# Patient Record
Sex: Female | Born: 1945 | Race: White | Hispanic: No | Marital: Married | Smoking: Former smoker
Health system: Southern US, Community
[De-identification: ages and names within clinical notes are randomized; demographics above are authoritative.]

## PROBLEM LIST (undated history)

## (undated) DIAGNOSIS — R112 Nausea with vomiting, unspecified: Secondary | ICD-10-CM

## (undated) DIAGNOSIS — C801 Malignant (primary) neoplasm, unspecified: Secondary | ICD-10-CM

## (undated) DIAGNOSIS — Z923 Personal history of irradiation: Secondary | ICD-10-CM

## (undated) DIAGNOSIS — Z8489 Family history of other specified conditions: Secondary | ICD-10-CM

## (undated) DIAGNOSIS — C50919 Malignant neoplasm of unspecified site of unspecified female breast: Secondary | ICD-10-CM

## (undated) DIAGNOSIS — Z9889 Other specified postprocedural states: Secondary | ICD-10-CM

## (undated) DIAGNOSIS — I10 Essential (primary) hypertension: Secondary | ICD-10-CM

## (undated) DIAGNOSIS — K449 Diaphragmatic hernia without obstruction or gangrene: Secondary | ICD-10-CM

## (undated) DIAGNOSIS — K219 Gastro-esophageal reflux disease without esophagitis: Secondary | ICD-10-CM

## (undated) DIAGNOSIS — Z9221 Personal history of antineoplastic chemotherapy: Secondary | ICD-10-CM

## (undated) HISTORY — PX: COLONOSCOPY: SHX174

## (undated) HISTORY — PX: ROTATOR CUFF REPAIR: SHX139

## (undated) HISTORY — PX: ABDOMINAL HYSTERECTOMY: SHX81

## (undated) HISTORY — PX: MASTECTOMY: SHX3

---

## 1989-10-13 HISTORY — PX: BREAST EXCISIONAL BIOPSY: SUR124

## 1997-10-13 HISTORY — PX: EXCISION / BIOPSY BREAST / NIPPLE / DUCT: SUR469

## 1998-03-01 ENCOUNTER — Other Ambulatory Visit: Admission: RE | Admit: 1998-03-01 | Discharge: 1998-03-01 | Payer: Self-pay | Admitting: General Surgery

## 1998-04-24 ENCOUNTER — Ambulatory Visit (HOSPITAL_COMMUNITY): Admission: RE | Admit: 1998-04-24 | Discharge: 1998-04-24 | Payer: Self-pay | Admitting: *Deleted

## 1998-05-07 ENCOUNTER — Encounter: Admission: RE | Admit: 1998-05-07 | Discharge: 1998-08-05 | Payer: Self-pay | Admitting: *Deleted

## 1998-05-09 ENCOUNTER — Ambulatory Visit (HOSPITAL_COMMUNITY): Admission: RE | Admit: 1998-05-09 | Discharge: 1998-05-09 | Payer: Self-pay | Admitting: *Deleted

## 1998-05-28 ENCOUNTER — Ambulatory Visit (HOSPITAL_COMMUNITY): Admission: RE | Admit: 1998-05-28 | Discharge: 1998-05-28 | Payer: Self-pay | Admitting: Hematology & Oncology

## 1998-05-29 ENCOUNTER — Ambulatory Visit (HOSPITAL_BASED_OUTPATIENT_CLINIC_OR_DEPARTMENT_OTHER): Admission: RE | Admit: 1998-05-29 | Discharge: 1998-05-29 | Payer: Self-pay | Admitting: *Deleted

## 1998-09-14 ENCOUNTER — Encounter: Admission: RE | Admit: 1998-09-14 | Discharge: 1998-12-13 | Payer: Self-pay | Admitting: *Deleted

## 1999-04-30 ENCOUNTER — Other Ambulatory Visit: Admission: RE | Admit: 1999-04-30 | Discharge: 1999-04-30 | Payer: Self-pay | Admitting: Family Medicine

## 1999-11-12 ENCOUNTER — Encounter: Payer: Self-pay | Admitting: Surgery

## 1999-11-12 ENCOUNTER — Encounter: Admission: RE | Admit: 1999-11-12 | Discharge: 1999-11-12 | Payer: Self-pay | Admitting: Surgery

## 2000-04-07 ENCOUNTER — Encounter: Admission: RE | Admit: 2000-04-07 | Discharge: 2000-04-07 | Payer: Self-pay | Admitting: Family Medicine

## 2000-04-07 ENCOUNTER — Encounter: Payer: Self-pay | Admitting: Family Medicine

## 2000-04-30 ENCOUNTER — Other Ambulatory Visit: Admission: RE | Admit: 2000-04-30 | Discharge: 2000-04-30 | Payer: Self-pay | Admitting: Family Medicine

## 2001-04-09 ENCOUNTER — Encounter: Admission: RE | Admit: 2001-04-09 | Discharge: 2001-04-09 | Payer: Self-pay | Admitting: Family Medicine

## 2001-04-09 ENCOUNTER — Encounter: Payer: Self-pay | Admitting: Family Medicine

## 2001-05-06 ENCOUNTER — Other Ambulatory Visit: Admission: RE | Admit: 2001-05-06 | Discharge: 2001-05-06 | Payer: Self-pay | Admitting: Family Medicine

## 2001-12-03 ENCOUNTER — Encounter: Payer: Self-pay | Admitting: Family Medicine

## 2001-12-03 ENCOUNTER — Encounter: Admission: RE | Admit: 2001-12-03 | Discharge: 2001-12-03 | Payer: Self-pay | Admitting: Family Medicine

## 2002-04-11 ENCOUNTER — Encounter: Payer: Self-pay | Admitting: Hematology & Oncology

## 2002-04-11 ENCOUNTER — Encounter: Admission: RE | Admit: 2002-04-11 | Discharge: 2002-04-11 | Payer: Self-pay | Admitting: Hematology & Oncology

## 2002-05-13 ENCOUNTER — Other Ambulatory Visit: Admission: RE | Admit: 2002-05-13 | Discharge: 2002-05-13 | Payer: Self-pay | Admitting: Family Medicine

## 2003-05-17 ENCOUNTER — Other Ambulatory Visit: Admission: RE | Admit: 2003-05-17 | Discharge: 2003-05-17 | Payer: Self-pay | Admitting: Family Medicine

## 2003-06-02 ENCOUNTER — Encounter: Payer: Self-pay | Admitting: Family Medicine

## 2003-06-02 ENCOUNTER — Encounter: Admission: RE | Admit: 2003-06-02 | Discharge: 2003-06-02 | Payer: Self-pay | Admitting: Hematology & Oncology

## 2003-06-02 ENCOUNTER — Encounter: Payer: Self-pay | Admitting: Hematology & Oncology

## 2003-06-02 ENCOUNTER — Encounter: Admission: RE | Admit: 2003-06-02 | Discharge: 2003-06-02 | Payer: Self-pay | Admitting: Family Medicine

## 2004-02-18 ENCOUNTER — Other Ambulatory Visit: Admission: RE | Admit: 2004-02-18 | Discharge: 2004-02-18 | Payer: Self-pay | Admitting: Family Medicine

## 2004-02-20 ENCOUNTER — Encounter: Admission: RE | Admit: 2004-02-20 | Discharge: 2004-02-20 | Payer: Self-pay | Admitting: Family Medicine

## 2004-06-05 ENCOUNTER — Encounter: Admission: RE | Admit: 2004-06-05 | Discharge: 2004-06-05 | Payer: Self-pay | Admitting: Hematology & Oncology

## 2005-01-02 ENCOUNTER — Ambulatory Visit: Payer: Self-pay | Admitting: Hematology & Oncology

## 2005-05-26 ENCOUNTER — Other Ambulatory Visit: Admission: RE | Admit: 2005-05-26 | Discharge: 2005-05-26 | Payer: Self-pay | Admitting: Family Medicine

## 2005-06-09 ENCOUNTER — Encounter: Admission: RE | Admit: 2005-06-09 | Discharge: 2005-06-09 | Payer: Self-pay | Admitting: Family Medicine

## 2006-01-06 ENCOUNTER — Ambulatory Visit: Payer: Self-pay | Admitting: Hematology & Oncology

## 2006-01-16 ENCOUNTER — Emergency Department (HOSPITAL_COMMUNITY): Admission: EM | Admit: 2006-01-16 | Discharge: 2006-01-16 | Payer: Self-pay | Admitting: Family Medicine

## 2006-06-12 ENCOUNTER — Encounter: Admission: RE | Admit: 2006-06-12 | Discharge: 2006-06-12 | Payer: Self-pay | Admitting: Family Medicine

## 2006-08-12 ENCOUNTER — Ambulatory Visit: Payer: Self-pay | Admitting: Hematology & Oncology

## 2007-01-04 ENCOUNTER — Ambulatory Visit: Payer: Self-pay | Admitting: Hematology & Oncology

## 2007-01-06 LAB — CBC WITH DIFFERENTIAL/PLATELET
Eosinophils Absolute: 0.1 10*3/uL (ref 0.0–0.5)
HCT: 41.3 % (ref 34.8–46.6)
LYMPH%: 14.8 % (ref 14.0–48.0)
MCHC: 35.1 g/dL (ref 32.0–36.0)
MONO#: 0.5 10*3/uL (ref 0.1–0.9)
MONO%: 4.6 % (ref 0.0–13.0)
NEUT%: 79.5 % — ABNORMAL HIGH (ref 39.6–76.8)
RDW: 13.1 % (ref 11.3–14.5)
WBC: 10 10*3/uL (ref 3.9–10.0)
lymph#: 1.5 10*3/uL (ref 0.9–3.3)

## 2007-01-06 LAB — COMPREHENSIVE METABOLIC PANEL
ALT: 19 U/L (ref 0–35)
AST: 19 U/L (ref 0–37)
Alkaline Phosphatase: 135 U/L — ABNORMAL HIGH (ref 39–117)
Creatinine, Ser: 0.76 mg/dL (ref 0.40–1.20)
Total Protein: 7.1 g/dL (ref 6.0–8.3)

## 2007-06-21 ENCOUNTER — Encounter: Admission: RE | Admit: 2007-06-21 | Discharge: 2007-06-21 | Payer: Self-pay | Admitting: Family Medicine

## 2007-12-29 ENCOUNTER — Ambulatory Visit: Payer: Self-pay | Admitting: Hematology & Oncology

## 2007-12-31 LAB — COMPREHENSIVE METABOLIC PANEL
ALT: 19 U/L (ref 0–35)
Albumin: 4.3 g/dL (ref 3.5–5.2)
Alkaline Phosphatase: 107 U/L (ref 39–117)
Creatinine, Ser: 0.75 mg/dL (ref 0.40–1.20)
Potassium: 4.9 mEq/L (ref 3.5–5.3)
Total Bilirubin: 0.4 mg/dL (ref 0.3–1.2)

## 2007-12-31 LAB — CBC WITH DIFFERENTIAL/PLATELET
Basophils Absolute: 0 10*3/uL (ref 0.0–0.1)
EOS%: 0.9 % (ref 0.0–7.0)
LYMPH%: 25.9 % (ref 14.0–48.0)
MCHC: 35 g/dL (ref 32.0–36.0)
MONO#: 0.4 10*3/uL (ref 0.1–0.9)
MONO%: 5.5 % (ref 0.0–13.0)
NEUT#: 4.9 10*3/uL (ref 1.5–6.5)
RBC: 4.77 10*6/uL (ref 3.70–5.32)
RDW: 13 % (ref 11.3–14.5)
lymph#: 1.9 10*3/uL (ref 0.9–3.3)

## 2008-06-28 ENCOUNTER — Encounter: Admission: RE | Admit: 2008-06-28 | Discharge: 2008-06-28 | Payer: Self-pay | Admitting: Hematology & Oncology

## 2008-12-14 ENCOUNTER — Ambulatory Visit: Payer: Self-pay | Admitting: Hematology & Oncology

## 2008-12-15 LAB — CBC WITH DIFFERENTIAL (CANCER CENTER ONLY)
BASO#: 0 10*3/uL (ref 0.0–0.2)
LYMPH#: 1.6 10*3/uL (ref 0.9–3.3)
MCH: 30.2 pg (ref 26.0–34.0)
MCHC: 33.5 g/dL (ref 32.0–36.0)
MONO#: 0.3 10*3/uL (ref 0.1–0.9)
MONO%: 4.4 % (ref 0.0–13.0)
Platelets: 226 10*3/uL (ref 145–400)
RDW: 11.2 % (ref 10.5–14.6)

## 2008-12-15 LAB — COMPREHENSIVE METABOLIC PANEL
ALT: 28 U/L (ref 0–35)
Calcium: 9.6 mg/dL (ref 8.4–10.5)
Chloride: 107 mEq/L (ref 96–112)
Creatinine, Ser: 0.72 mg/dL (ref 0.40–1.20)
Glucose, Bld: 108 mg/dL — ABNORMAL HIGH (ref 70–99)
Potassium: 4.9 mEq/L (ref 3.5–5.3)
Sodium: 145 mEq/L (ref 135–145)
Total Bilirubin: 0.3 mg/dL (ref 0.3–1.2)
Total Protein: 6.9 g/dL (ref 6.0–8.3)

## 2009-06-29 ENCOUNTER — Encounter: Admission: RE | Admit: 2009-06-29 | Discharge: 2009-06-29 | Payer: Self-pay | Admitting: Internal Medicine

## 2009-07-09 ENCOUNTER — Encounter: Admission: RE | Admit: 2009-07-09 | Discharge: 2009-07-09 | Payer: Self-pay | Admitting: Internal Medicine

## 2009-12-13 ENCOUNTER — Ambulatory Visit: Payer: Self-pay | Admitting: Hematology & Oncology

## 2009-12-14 LAB — CBC WITH DIFFERENTIAL (CANCER CENTER ONLY)
HCT: 41.9 % (ref 34.8–46.6)
HGB: 14.1 g/dL (ref 11.6–15.9)
LYMPH#: 1.6 10*3/uL (ref 0.9–3.3)
MCHC: 33.6 g/dL (ref 32.0–36.0)
MCV: 90 fL (ref 81–101)
MONO#: 0.3 10*3/uL (ref 0.1–0.9)
NEUT#: 5.5 10*3/uL (ref 1.5–6.5)
RDW: 11.7 % (ref 10.5–14.6)

## 2009-12-14 LAB — COMPREHENSIVE METABOLIC PANEL
Alkaline Phosphatase: 111 U/L (ref 39–117)
CO2: 26 mEq/L (ref 19–32)
Creatinine, Ser: 0.69 mg/dL (ref 0.40–1.20)
Glucose, Bld: 101 mg/dL — ABNORMAL HIGH (ref 70–99)
Total Bilirubin: 0.5 mg/dL (ref 0.3–1.2)

## 2010-07-02 ENCOUNTER — Encounter: Admission: RE | Admit: 2010-07-02 | Discharge: 2010-07-02 | Payer: Self-pay | Admitting: Hematology & Oncology

## 2010-11-04 ENCOUNTER — Encounter: Payer: Self-pay | Admitting: Internal Medicine

## 2011-01-01 ENCOUNTER — Encounter (HOSPITAL_BASED_OUTPATIENT_CLINIC_OR_DEPARTMENT_OTHER): Payer: 59 | Admitting: Hematology & Oncology

## 2011-01-01 ENCOUNTER — Other Ambulatory Visit: Payer: Self-pay | Admitting: Hematology & Oncology

## 2011-01-01 DIAGNOSIS — C50919 Malignant neoplasm of unspecified site of unspecified female breast: Secondary | ICD-10-CM

## 2011-01-01 DIAGNOSIS — Z171 Estrogen receptor negative status [ER-]: Secondary | ICD-10-CM

## 2011-01-01 LAB — CBC WITH DIFFERENTIAL (CANCER CENTER ONLY)
BASO%: 0.3 % (ref 0.0–2.0)
HCT: 39.6 % (ref 34.8–46.6)
MCV: 89 fL (ref 81–101)
MONO%: 5.8 % (ref 0.0–13.0)
NEUT#: 4.6 10*3/uL (ref 1.5–6.5)
RDW: 13.3 % (ref 11.1–15.7)
WBC: 6.7 10*3/uL (ref 3.9–10.0)

## 2011-01-02 LAB — COMPREHENSIVE METABOLIC PANEL
Albumin: 4.3 g/dL (ref 3.5–5.2)
BUN: 21 mg/dL (ref 6–23)
CO2: 25 mEq/L (ref 19–32)
Creatinine, Ser: 0.83 mg/dL (ref 0.40–1.20)
Potassium: 4.7 mEq/L (ref 3.5–5.3)
Sodium: 142 mEq/L (ref 135–145)
Total Bilirubin: 0.4 mg/dL (ref 0.3–1.2)

## 2011-01-02 LAB — VITAMIN D 25 HYDROXY (VIT D DEFICIENCY, FRACTURES): Vit D, 25-Hydroxy: 46 ng/mL (ref 30–89)

## 2011-05-29 ENCOUNTER — Other Ambulatory Visit: Payer: Self-pay | Admitting: Internal Medicine

## 2011-05-29 DIAGNOSIS — Z1231 Encounter for screening mammogram for malignant neoplasm of breast: Secondary | ICD-10-CM

## 2011-07-10 ENCOUNTER — Ambulatory Visit
Admission: RE | Admit: 2011-07-10 | Discharge: 2011-07-10 | Disposition: A | Discharge status: Home / Self Care | Payer: 59 | Source: Ambulatory Visit | Attending: Internal Medicine | Admitting: Internal Medicine

## 2011-07-10 DIAGNOSIS — Z1231 Encounter for screening mammogram for malignant neoplasm of breast: Secondary | ICD-10-CM

## 2012-01-20 ENCOUNTER — Ambulatory Visit (HOSPITAL_BASED_OUTPATIENT_CLINIC_OR_DEPARTMENT_OTHER): Payer: 59 | Admitting: Hematology & Oncology

## 2012-01-20 ENCOUNTER — Other Ambulatory Visit (HOSPITAL_BASED_OUTPATIENT_CLINIC_OR_DEPARTMENT_OTHER): Payer: 59 | Admitting: Lab

## 2012-01-20 VITALS — BP 130/91 | HR 91 | Temp 96.1°F | Ht 67.0 in | Wt 177.0 lb

## 2012-01-20 DIAGNOSIS — C50919 Malignant neoplasm of unspecified site of unspecified female breast: Secondary | ICD-10-CM

## 2012-01-20 DIAGNOSIS — Z853 Personal history of malignant neoplasm of breast: Secondary | ICD-10-CM

## 2012-01-20 LAB — CBC WITH DIFFERENTIAL (CANCER CENTER ONLY)
HCT: 41.7 % (ref 34.8–46.6)
HGB: 13.8 g/dL (ref 11.6–15.9)
MCH: 29.8 pg (ref 26.0–34.0)
MCHC: 33.1 g/dL (ref 32.0–36.0)
MCV: 90 fL (ref 81–101)
NEUT#: 4.6 10*3/uL (ref 1.5–6.5)
NEUT%: 61.4 % (ref 39.6–80.0)
RBC: 4.63 10*6/uL (ref 3.70–5.32)
RDW: 13.2 % (ref 11.1–15.7)
WBC: 7.5 10*3/uL (ref 3.9–10.0)

## 2012-01-20 NOTE — Progress Notes (Signed)
This office note has been dictated.

## 2012-01-21 LAB — VITAMIN D 25 HYDROXY (VIT D DEFICIENCY, FRACTURES): Vit D, 25-Hydroxy: 45 ng/mL (ref 30–89)

## 2012-01-21 LAB — COMPREHENSIVE METABOLIC PANEL
Albumin: 4.3 g/dL (ref 3.5–5.2)
CO2: 30 mEq/L (ref 19–32)
Calcium: 9.4 mg/dL (ref 8.4–10.5)
Chloride: 103 mEq/L (ref 96–112)
Glucose, Bld: 94 mg/dL (ref 70–99)
Potassium: 4.3 mEq/L (ref 3.5–5.3)
Sodium: 141 mEq/L (ref 135–145)
Total Protein: 6.8 g/dL (ref 6.0–8.3)

## 2012-01-26 ENCOUNTER — Telehealth: Payer: Self-pay | Admitting: *Deleted

## 2012-01-26 NOTE — Telephone Encounter (Addendum)
Message copied by Mirian Capuchin on Mon Jan 26, 2012  2:22 PM ------      Message from: Arlan Organ R      Created: Wed Jan 21, 2012  9:22 PM       Call labs ok!! This message left on pt's home answering machine.

## 2012-01-30 NOTE — Progress Notes (Signed)
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  CURRENT THERAPY:  Observation.  INTERVAL HISTORY:  Ms. Ganesh comes in for followup.  We see her yearly.  She has been doing well.  She has had no complaints since we last saw her.  She has had no problems with fatigue or weakness.  There has been no bony pain.  She has had no change in bowel or bladder habits.  Her last mammogram was done back in September 2012.  Everything looked okay on the mammogram.  MEDICATIONS:  She has had no change in medications.  She does take aspirin.  She also takes vitamin D.  PHYSICAL EXAMINATION:  This is a well-developed, well-nourished white female in no obvious distress.  Vital signs:  96.6, pulse 91, respiratory rate 18, blood pressure 130/91.  Weight is 177.  Head and neck exam shows a normocephalic, atraumatic skull.  There are no ocular or oral lesions.  There are no palpable cervical or supraclavicular lymph nodes.  Lungs:  Clear bilaterally.  Cardiac: Regular rate and rhythm with a normal S1 and S2.  There are no murmurs, rubs or bruits. Breasts: Right breast with no masses, edema or erythema.  There is no right axillary adenopathy.  Left breast shows well-healed lumpectomy at the 11 o'clock position.  There is some slight contraction of the left breast.  There is some slight firmness at the lumpectomy site.  No distinct mass is noted in the left breast. There is no left axillary adenopathy.  Abdomen:  Soft with good bowel sounds.  There is no palpable abdominal mass.  There is no fluid wave.  There is no palpable hepatosplenomegaly. Back:  No kyphosis or osteoporotic changes. Extremities:  No clubbing, cyanosis or edema.  She has good range motion of her joints.  Skin:  No rashes, ecchymoses or petechia.  LABORATORY STUDIES:  White cell count 7.5, hemoglobin 13.2, hematocrit 41.7 and platelet count is 219.  Her metabolic panel shows normal  liver function tests.  Alkaline phosphatase is minimally elevated at 136.  Total protein 6.8.  Albumin is 4.3.  IMPRESSION:  Ms. Albano is a 66 year old white female with a history of stage I infiltrating ductal carcinoma left breast.  She underwent adjuvant chemotherapy with 4 cycles Adriamycin/Cytoxan.  She had radiation therapy.  She is now out from her chemotherapy by 13 years. She completed chemotherapy back in November of 1999.  We will continue to see her yearly.  She prefers to come back to see [place] so that we can maintain followup.  I encouraged her to continue the vitamin D, and aspirin.    ______________________________ Josph Macho, M.D. PRE/MEDQ  D:  01/29/2012  T:  01/30/2012  Job:  [id number]

## 2012-06-24 ENCOUNTER — Other Ambulatory Visit: Payer: Self-pay | Admitting: Internal Medicine

## 2012-06-24 DIAGNOSIS — Z1231 Encounter for screening mammogram for malignant neoplasm of breast: Secondary | ICD-10-CM

## 2012-07-14 ENCOUNTER — Ambulatory Visit: Payer: 59

## 2012-07-14 ENCOUNTER — Ambulatory Visit
Admission: RE | Admit: 2012-07-14 | Discharge: 2012-07-14 | Disposition: A | Discharge status: Home / Self Care | Payer: 59 | Source: Ambulatory Visit | Attending: Internal Medicine | Admitting: Internal Medicine

## 2012-07-14 DIAGNOSIS — Z1231 Encounter for screening mammogram for malignant neoplasm of breast: Secondary | ICD-10-CM

## 2012-10-29 ENCOUNTER — Other Ambulatory Visit: Payer: Self-pay | Admitting: Gastroenterology

## 2012-10-29 DIAGNOSIS — IMO0001 Reserved for inherently not codable concepts without codable children: Secondary | ICD-10-CM

## 2012-11-05 ENCOUNTER — Ambulatory Visit
Admission: RE | Admit: 2012-11-05 | Discharge: 2012-11-05 | Disposition: A | Discharge status: Home / Self Care | Payer: 59 | Source: Ambulatory Visit | Attending: Gastroenterology | Admitting: Gastroenterology

## 2012-11-05 DIAGNOSIS — IMO0001 Reserved for inherently not codable concepts without codable children: Secondary | ICD-10-CM

## 2013-01-19 ENCOUNTER — Ambulatory Visit: Payer: 59 | Admitting: Hematology & Oncology

## 2013-01-19 ENCOUNTER — Other Ambulatory Visit: Payer: 59 | Admitting: Lab

## 2013-01-20 ENCOUNTER — Ambulatory Visit (HOSPITAL_BASED_OUTPATIENT_CLINIC_OR_DEPARTMENT_OTHER): Payer: 59 | Admitting: Hematology & Oncology

## 2013-01-20 ENCOUNTER — Other Ambulatory Visit (HOSPITAL_BASED_OUTPATIENT_CLINIC_OR_DEPARTMENT_OTHER): Payer: 59 | Admitting: Lab

## 2013-01-20 VITALS — BP 105/62 | HR 83 | Temp 97.8°F | Resp 16 | Ht 67.0 in | Wt 184.0 lb

## 2013-01-20 DIAGNOSIS — Z853 Personal history of malignant neoplasm of breast: Secondary | ICD-10-CM

## 2013-01-20 DIAGNOSIS — C50912 Malignant neoplasm of unspecified site of left female breast: Secondary | ICD-10-CM

## 2013-01-20 DIAGNOSIS — M810 Age-related osteoporosis without current pathological fracture: Secondary | ICD-10-CM

## 2013-01-20 LAB — CBC WITH DIFFERENTIAL (CANCER CENTER ONLY)
BASO%: 0.5 % (ref 0.0–2.0)
LYMPH#: 1.5 10*3/uL (ref 0.9–3.3)
LYMPH%: 25.4 % (ref 14.0–48.0)
MCV: 90 fL (ref 81–101)
MONO#: 0.4 10*3/uL (ref 0.1–0.9)
Platelets: 220 10*3/uL (ref 145–400)
RDW: 13.5 % (ref 11.1–15.7)
WBC: 5.7 10*3/uL (ref 3.9–10.0)

## 2013-01-20 NOTE — Progress Notes (Signed)
This office note has been dictated.

## 2013-01-21 LAB — COMPREHENSIVE METABOLIC PANEL
ALT: 19 U/L (ref 0–35)
Albumin: 3.9 g/dL (ref 3.5–5.2)
CO2: 30 mEq/L (ref 19–32)
Glucose, Bld: 105 mg/dL — ABNORMAL HIGH (ref 70–99)
Potassium: 4.3 mEq/L (ref 3.5–5.3)
Sodium: 141 mEq/L (ref 135–145)
Total Bilirubin: 0.4 mg/dL (ref 0.3–1.2)
Total Protein: 6.4 g/dL (ref 6.0–8.3)

## 2013-01-21 LAB — VITAMIN D 25 HYDROXY (VIT D DEFICIENCY, FRACTURES): Vit D, 25-Hydroxy: 43 ng/mL (ref 30–89)

## 2013-01-21 NOTE — Progress Notes (Signed)
CC:   Georgann Housekeeper, MD  DIAGNOSIS:  Stage I (T1 N0 M0) infiltrating ductal carcinoma of the left breast.  CURRENT THERAPY:  Observation.  INTERIM HISTORY:  Ms. Mccrumb comes in for her yearly followup.  She had a little bit of a problem with "pneumonia."  She saw Dr. Donette Larry.  He got her on some antibiotics and some decongestant.  She says that she is feeling better.  Her last mammogram was done back in October 2013.  She is having some problems with reflux.  She is going to see Dr. Dulce Sellar of [facility] for upper endoscopy.  She has had no change in bowel or bladder habits.  There has been no leg swelling.  There have been no fevers, sweats, or chills.  There have been no rashes.  She does have quite a few seborrheic keratoses.  PHYSICAL EXAMINATION:  General:  This is a well-developed, well- nourished white female in no obvious distress.  Vital signs: Temperature of 97.8, pulse 83, respiratory rate 16, blood pressure 105/62.  Weight is 184.  Head and neck:  Normocephalic, atraumatic skull.  There are no ocular or oral lesions.  There are no palpable cervical or supraclavicular lymph nodes.  Lungs:  Clear to percussion and auscultation bilaterally.  Cardiac:  Regular rate and rhythm with a normal S1 and S2.  There are no murmurs, rubs, or bruits.  Abdomen: Soft with good bowel sounds.  There is no palpable abdominal mass. There is no fluid wave.  There is no palpable hepatosplenomegaly. Breasts:  Right breast with no masses, edema, or erythema.  There is no right axillary adenopathy.  Left breast shows well-healed lumpectomy at the 11 o'clock position.  There may be some slight contraction of the left breast from radiation.  No distinct mass is noted in the left breast.  There is no left axillary adenopathy.  Back:  No kyphosis or osteoporotic changes.  Extremities:  No clubbing, cyanosis, or edema. Neurological:  No focal neurological deficits.  LABORATORY  STUDIES:  White cell count 5.7, hemoglobin 12.2, hematocrit 38.1, platelet count 220.  IMPRESSION:  Selena Cruz is a 67 year old white female with stage I infiltrating ductal carcinoma of the left breast.  She is now out by almost 14 years.  She completed all of her treatments back in November 1999.  She had 4 cycles of Adriamycin/Cytoxan followed by radiation.  I do not see any issues with respect to long-term complications of her chemotherapy.  We will get her back in 1 more year for followup.    ______________________________ Josph Macho, M.D. PRE/MEDQ  D:  01/20/2013  T:  01/21/2013  Job:  [id number]

## 2013-01-24 ENCOUNTER — Telehealth: Payer: Self-pay

## 2013-01-24 NOTE — Telephone Encounter (Addendum)
Message copied by Leana Gamer on Mon Jan 24, 2013 10:39 AM ------      Message from: Selena Cruz      Created: Sun Jan 23, 2013  8:15 PM       Call - labs and vit D are good!!  Pete ------pt given her labs and vit D  As being good. Pt happy with her result.

## 2013-01-31 ENCOUNTER — Other Ambulatory Visit: Payer: Self-pay | Admitting: Gastroenterology

## 2013-01-31 ENCOUNTER — Encounter (HOSPITAL_COMMUNITY)
Admission: RE | Disposition: A | Discharge status: Home / Self Care | Payer: Self-pay | Source: Ambulatory Visit | Attending: Gastroenterology

## 2013-01-31 ENCOUNTER — Encounter (HOSPITAL_COMMUNITY): Payer: Self-pay | Admitting: *Deleted

## 2013-01-31 ENCOUNTER — Ambulatory Visit (HOSPITAL_COMMUNITY)
Admission: RE | Admit: 2013-01-31 | Discharge: 2013-01-31 | Disposition: A | Discharge status: Home / Self Care | Payer: 59 | Source: Ambulatory Visit | Attending: Gastroenterology | Admitting: Gastroenterology

## 2013-01-31 DIAGNOSIS — K2960 Other gastritis without bleeding: Secondary | ICD-10-CM | POA: Insufficient documentation

## 2013-01-31 DIAGNOSIS — K449 Diaphragmatic hernia without obstruction or gangrene: Secondary | ICD-10-CM | POA: Insufficient documentation

## 2013-01-31 DIAGNOSIS — K219 Gastro-esophageal reflux disease without esophagitis: Secondary | ICD-10-CM | POA: Insufficient documentation

## 2013-01-31 DIAGNOSIS — Z79899 Other long term (current) drug therapy: Secondary | ICD-10-CM | POA: Insufficient documentation

## 2013-01-31 HISTORY — PX: ESOPHAGOGASTRODUODENOSCOPY: SHX5428

## 2013-01-31 HISTORY — DX: Essential (primary) hypertension: I10

## 2013-01-31 HISTORY — DX: Gastro-esophageal reflux disease without esophagitis: K21.9

## 2013-01-31 HISTORY — PX: BRAVO PH STUDY: SHX5421

## 2013-01-31 HISTORY — DX: Malignant (primary) neoplasm, unspecified: C80.1

## 2013-01-31 HISTORY — DX: Diaphragmatic hernia without obstruction or gangrene: K44.9

## 2013-01-31 SURGERY — EGD (ESOPHAGOGASTRODUODENOSCOPY)
Anesthesia: Moderate Sedation

## 2013-01-31 MED ORDER — BUTAMBEN-TETRACAINE-BENZOCAINE 2-2-14 % EX AERO
INHALATION_SPRAY | CUTANEOUS | Status: DC | PRN
  Administered 2013-01-31: 2 via TOPICAL

## 2013-01-31 MED ORDER — MIDAZOLAM HCL 10 MG/2ML IJ SOLN
INTRAMUSCULAR | Status: AC
  Filled 2013-01-31: qty 2

## 2013-01-31 MED ORDER — SODIUM CHLORIDE 0.9 % IV SOLN: INTRAVENOUS | Status: DC

## 2013-01-31 MED ORDER — FENTANYL CITRATE 0.05 MG/ML IJ SOLN
INTRAMUSCULAR | Status: DC | PRN
  Administered 2013-01-31 (×3): 25 ug via INTRAVENOUS

## 2013-01-31 MED ORDER — MIDAZOLAM HCL 10 MG/2ML IJ SOLN
INTRAMUSCULAR | Status: DC | PRN
  Administered 2013-01-31: 1 mg via INTRAVENOUS
  Administered 2013-01-31 (×2): 2 mg via INTRAVENOUS

## 2013-01-31 MED ORDER — FENTANYL CITRATE 0.05 MG/ML IJ SOLN
INTRAMUSCULAR | Status: AC
  Filled 2013-01-31: qty 2

## 2013-01-31 NOTE — H&P (Signed)
Patient interval history reviewed.  Patient examined again.  There has been no change from documented H/P dated 01/18/13 (scanned into chart from our office) except as documented above.  Assessment:  1.  GERD, refractory to escalating medical therapy.  Plan:  1.  EGD with bravo (pH catheter) placement. 2.  Risks (bleeding, infection, bowel perforation that could require surgery, sedation-related changes in cardiopulmonary systems), benefits (identification and possible treatment of source of symptoms, exclusion of certain causes of symptoms), and alternatives (watchful waiting, radiographic imaging studies, empiric medical treatment) of upper endoscopy with Bravo placement (EGD + Bravo) were explained to patient/family in detail and patient wishes to proceed.

## 2013-01-31 NOTE — Op Note (Signed)
[facility] [address] [place], [address]   ENDOSCOPY PROCEDURE REPORT  PATIENT: Selena Cruz, Selena Cruz  MR#: [id number] BIRTHDATE: January 21, 1946 , 67  yrs. old GENDER: Female ENDOSCOPIST: Willis Modena, MD REFERRED BY:  Georgann Housekeeper, M.D. PROCEDURE DATE:  01/31/2013 PROCEDURE:  EGD w/ Bravo capsule placement  (ON PPI and H2RB medication) ASA CLASS:     Class II INDICATIONS:  GERD (refractory to medical therapy). MEDICATIONS: Fentanyl 75 mcg IV and Versed 5 mg IV TOPICAL ANESTHETIC: Cetacaine Spray DESCRIPTION OF PROCEDURE: After the risks benefits and alternatives of the procedure were thoroughly explained, informed consent was obtained.  The endoscope [id number] ([id number]) endoscope was introduced through the mouth and advanced to the second portion of the duodenum. Without limitations.  The instrument was slowly withdrawn as the mucosa was fully examined.    Findings:  Small hiatal hernia.  Mild antral gastritis.  Otherwise normal endoscopy to the second portion of the duodenum.  After completion of our diagnostic exam, a Bravo capsule was placed 6 cm proximal (30cm from incisors) to GE junction (36cm from incisors) in standard fashion.  Second look endoscopy confirmed appropriate capsule positioning.           The scope was then withdrawn from the patient and the procedure completed.  ENDOSCOPIC IMPRESSION:     As above.  Successful capsule placement, as above.  RECOMMENDATIONS:     1.  Watch for potential complications of procedure. 2.  Continue esomeprazole and ranitidine. 3.  Await bravo results. 4.  Follow-up in office in 4-6 weeks.  eSigned:  Willis Modena, MD 01/31/2013 11:58 AM   CC:

## 2013-01-31 NOTE — Addendum Note (Signed)
Addended by: Willis Modena on: 01/31/2013 11:20 AM   Modules accepted: Orders

## 2013-02-01 ENCOUNTER — Encounter (HOSPITAL_COMMUNITY): Payer: Self-pay | Admitting: Gastroenterology

## 2013-05-18 ENCOUNTER — Other Ambulatory Visit: Payer: Self-pay

## 2013-07-19 ENCOUNTER — Other Ambulatory Visit: Payer: Self-pay

## 2013-07-19 DIAGNOSIS — Z1231 Encounter for screening mammogram for malignant neoplasm of breast: Secondary | ICD-10-CM

## 2013-08-02 ENCOUNTER — Other Ambulatory Visit: Payer: Self-pay | Admitting: Internal Medicine

## 2013-08-02 DIAGNOSIS — R748 Abnormal levels of other serum enzymes: Secondary | ICD-10-CM

## 2013-08-05 ENCOUNTER — Ambulatory Visit
Admission: RE | Admit: 2013-08-05 | Discharge: 2013-08-05 | Disposition: A | Discharge status: Home / Self Care | Payer: 59 | Source: Ambulatory Visit | Attending: Internal Medicine | Admitting: Internal Medicine

## 2013-08-05 DIAGNOSIS — R748 Abnormal levels of other serum enzymes: Secondary | ICD-10-CM

## 2013-08-17 ENCOUNTER — Ambulatory Visit
Admission: RE | Admit: 2013-08-17 | Discharge: 2013-08-17 | Disposition: A | Discharge status: Home / Self Care | Payer: 59 | Source: Ambulatory Visit

## 2013-08-17 DIAGNOSIS — Z1231 Encounter for screening mammogram for malignant neoplasm of breast: Secondary | ICD-10-CM

## 2013-08-18 ENCOUNTER — Other Ambulatory Visit: Payer: Self-pay

## 2013-11-19 ENCOUNTER — Emergency Department (INDEPENDENT_AMBULATORY_CARE_PROVIDER_SITE_OTHER)
Admission: EM | Admit: 2013-11-19 | Discharge: 2013-11-19 | Disposition: A | Discharge status: Home / Self Care | Payer: BC Managed Care – PPO | Source: Home / Self Care | Attending: Family Medicine | Admitting: Family Medicine

## 2013-11-19 ENCOUNTER — Encounter (HOSPITAL_COMMUNITY): Payer: Self-pay | Admitting: Emergency Medicine

## 2013-11-19 DIAGNOSIS — B029 Zoster without complications: Secondary | ICD-10-CM

## 2013-11-19 MED ORDER — OXYCODONE-ACETAMINOPHEN 5-325 MG PO TABS: 1.0000 | ORAL_TABLET | Freq: Four times a day (QID) | ORAL | Status: DC | PRN

## 2013-11-19 MED ORDER — VALACYCLOVIR HCL 1 G PO TABS: 1000.0000 mg | ORAL_TABLET | Freq: Three times a day (TID) | ORAL | Status: DC

## 2013-11-19 NOTE — ED Notes (Signed)
Initial discharge condition note not related to this patient

## 2013-11-19 NOTE — ED Notes (Signed)
Patient reports pain in right arm, shoulder and into neck for 3-4 weeks.  Noticed rash for 9 days ago.  Reports increased pain and visible blisters over the past few days

## 2013-11-19 NOTE — ED Provider Notes (Signed)
CSN: [id number]     Arrival date & time 11/19/13  4098 History   First MD Initiated Contact with Patient 11/19/13 1040     Chief Complaint  Patient presents with  . Rash   (Consider location/radiation/quality/duration/timing/severity/associated sxs/prior Treatment) HPI Comments: Pt with R shoulder and arm pain for last 4 weeks. 9 days ago, began developing rash on R elbow that is blisters, red, painful. Aleve helps relieve a bit. Did get zostavax.   Patient is a 68 y.o. female presenting with rash. The history is provided by the patient.  Rash Location:  Shoulder/arm Shoulder/arm rash location:  R shoulder, R upper arm and R forearm Quality: blistering, painful and redness   Pain details:    Quality:  Burning and aching   Severity:  Moderate   Onset quality:  Gradual   Duration:  1 month   Timing:  Constant   Progression:  Worsening Severity:  Moderate Onset quality:  Gradual Duration:  9 days Timing:  Constant Progression:  Spreading Chronicity:  New Relieved by:  OTC analgesics Worsened by:  Nothing tried Associated symptoms: no fever     Past Medical History  Diagnosis Date  . Hypertension   . GERD (gastroesophageal reflux disease)   . Cancer     breast CA  . Hiatal hernia    Past Surgical History  Procedure Laterality Date  . Mastectomy      partial on left  . Abdominal hysterectomy    . Rotator cuff repair    . Colonoscopy    . Esophagogastroduodenoscopy N/A 01/31/2013    Procedure: ESOPHAGOGASTRODUODENOSCOPY (EGD);  Surgeon: Arta Silence, MD;  Location: [facility];  Service: Endoscopy;  Laterality: N/A;  . Bravo ph study N/A 01/31/2013    Procedure: BRAVO Lillian;  Surgeon: Arta Silence, MD;  Location: [facility];  Service: Endoscopy;  Laterality: N/A;   Family History  Problem Relation Age of Onset  . Heart attack Mother   . Heart failure Father   . Heart failure Brother   . CAD Brother    History  Substance Use Topics  . Smoking status:  Former Smoker    Quit date: 09/21/2009  . Smokeless tobacco: Never Used  . Alcohol Use: No   OB History   Grav Para Term Preterm Abortions TAB SAB Ect Mult Living                 Review of Systems  Constitutional: Negative for fever and chills.  Musculoskeletal:       R arm/shoulder pain  Skin: Positive for rash.    Allergies  Review of patient's allergies indicates no known allergies.  Home Medications   Current Outpatient Rx  Name  Route  Sig  Dispense  Refill  . aspirin 81 MG tablet   Oral   Take 81 mg by mouth daily.         Marland Kitchen atorvastatin (LIPITOR) 20 MG tablet   Oral   Take 20 mg by mouth daily.         . Cetirizine HCl (ZYRTEC ALLERGY PO)   Oral   Take by mouth every morning.         . cholecalciferol (VITAMIN D) 1000 UNITS tablet   Oral   Take 1,000 Units by mouth daily.         Marland Kitchen esomeprazole (NEXIUM) 40 MG capsule   Oral   Take 40 mg by mouth daily before breakfast.         .  Flaxseed, Linseed, (FLAX SEEDS PO)   Oral   Take by mouth every morning.         . Multiple Vitamin (MULTI-VITAMIN DAILY PO)   Oral   Take by mouth every morning.         Marland Kitchen oxyCODONE-acetaminophen (PERCOCET/ROXICET) 5-325 MG per tablet   Oral   Take 1 tablet by mouth every 6 (six) hours as needed for severe pain.   6 tablet   0   . Probiotic Product (PROBIOTIC DAILY PO)   Oral   Take by mouth every morning.         . ranitidine (ZANTAC) 150 MG tablet   Oral   Take 150 mg by mouth at bedtime.         Marland Kitchen telmisartan (MICARDIS) 40 MG tablet   Oral   Take 40 mg by mouth daily.         . valACYclovir (VALTREX) 1000 MG tablet   Oral   Take 1 tablet (1,000 mg total) by mouth 3 (three) times daily.   21 tablet   0    BP 142/86  Pulse 80  Temp(Src) 97.2 F (36.2 C) (Oral)  Resp 20  SpO2 94% Physical Exam  Constitutional: She appears well-developed and well-nourished. No distress.  Musculoskeletal:       Arms: Skin: Skin is warm and dry.  Rash noted. Rash is vesicular. There is erythema.  See msk for location of rash.  Sx and exam c/w shingles in R c5 and c6 dermatomes.     ED Course  Procedures (including critical care time) Labs Review Labs Reviewed - No data to display Imaging Review No results found.    MDM   1. Shingles   rx valacyclovir 1000mg  TID #21 and percocet 5/325 1 q6hours prn pain. Pt to f/u with pcp.      Carvel Getting, NP 11/19/13 1046

## 2013-11-19 NOTE — ED Provider Notes (Signed)
Medical screening examination/treatment/procedure(s) were performed by a resident physician or non-physician practitioner and as the supervising physician I was immediately available for consultation/collaboration.  Lynne Leader, MD    Gregor Hams, MD 11/19/13 2030

## 2013-11-19 NOTE — Discharge Instructions (Signed)
Follow up with Dr. Shelia Media.  Continue taking the Aleve every 12 hours if needed for pain. If that isn't working enough to manage your pain, take the prescription oxycodone/acetaminophen medicine (percocet).    Shingles Shingles (herpes zoster) is an infection that is caused by the same virus that causes chickenpox (varicella). The infection causes a painful skin rash and fluid-filled blisters, which eventually break open, crust over, and heal. It may occur in any area of the body, but it usually affects only one side of the body or face. The pain of shingles usually lasts about 1 month. However, some people with shingles may develop long-term (chronic) pain in the affected area of the body. Shingles often occurs many years after the person had chickenpox. It is more common:  In people older than 50 years.  In people with weakened immune systems, such as those with HIV, AIDS, or cancer.  In people taking medicines that weaken the immune system, such as transplant medicines.  In people under great stress. CAUSES  Shingles is caused by the varicella zoster virus (VZV), which also causes chickenpox. After a person is infected with the virus, it can remain in the person's body for years in an inactive state (dormant). To cause shingles, the virus reactivates and breaks out as an infection in a nerve root. The virus can be spread from person to person (contagious) through contact with open blisters of the shingles rash. It will only spread to people who have not had chickenpox. When these people are exposed to the virus, they may develop chickenpox. They will not develop shingles. Once the blisters scab over, the person is no longer contagious and cannot spread the virus to others. SYMPTOMS  Shingles shows up in stages. The initial symptoms may be pain, itching, and tingling in an area of the skin. This pain is usually described as burning, stabbing, or throbbing.In a few days or weeks, a painful red rash  will appear in the area where the pain, itching, and tingling were felt. The rash is usually on one side of the body in a band or belt-like pattern. Then, the rash usually turns into fluid-filled blisters. They will scab over and dry up in approximately 2 3 weeks. Flu-like symptoms may also occur with the initial symptoms, the rash, or the blisters. These may include:  Fever.  Chills.  Headache.  Upset stomach. DIAGNOSIS  Your caregiver will perform a skin exam to diagnose shingles. Skin scrapings or fluid samples may also be taken from the blisters. This sample will be examined under a microscope or sent to a lab for further testing. TREATMENT  There is no specific cure for shingles. Your caregiver will likely prescribe medicines to help you manage the pain, recover faster, and avoid long-term problems. This may include antiviral drugs, anti-inflammatory drugs, and pain medicines. HOME CARE INSTRUCTIONS   Take a cool bath or apply cool compresses to the area of the rash or blisters as directed. This may help with the pain and itching.   Only take over-the-counter or prescription medicines as directed by your caregiver.   Rest as directed by your caregiver.  Keep your rash and blisters clean with mild soap and cool water or as directed by your caregiver.  Do not pick your blisters or scratch your rash. Apply an anti-itch cream or numbing creams to the affected area as directed by your caregiver.  Keep your shingles rash covered with a loose bandage (dressing).  Avoid skin contact with:  Babies.   Pregnant women.   Children with eczema.   Elderly people with transplants.   People with chronic illnesses, such as leukemia or AIDS.   Wear loose-fitting clothing to help ease the pain of material rubbing against the rash.  Keep all follow-up appointments with your caregiver.If the area involved is on your face, you may receive a referral for follow-up to a specialist,  such as an eye doctor (ophthalmologist) or an ear, nose, and throat (ENT) doctor. Keeping all follow-up appointments will help you avoid eye complications, chronic pain, or disability.  SEEK IMMEDIATE MEDICAL CARE IF:   You have facial pain, pain around the eye area, or loss of feeling on one side of your face.  You have ear pain or ringing in your ear.  You have loss of taste.  Your pain is not relieved with prescribed medicines.   Your redness or swelling spreads.   You have more pain and swelling.  Your condition is worsening or has changed.   You have a feveror persistent symptoms for more than 2 3 days.  You have a fever and your symptoms suddenly get worse. MAKE SURE YOU:  Understand these instructions.  Will watch your condition.  Will get help right away if you are not doing well or get worse. Document Released: 09/29/2005 Document Revised: 06/23/2012 Document Reviewed: 05/13/2012 [facility] Patient Information 2014 [place].

## 2013-11-22 ENCOUNTER — Other Ambulatory Visit: Payer: Self-pay | Admitting: Hematology & Oncology

## 2013-11-22 DIAGNOSIS — B0229 Other postherpetic nervous system involvement: Secondary | ICD-10-CM

## 2013-11-22 MED ORDER — GABAPENTIN 300 MG PO CAPS: 300.0000 mg | ORAL_CAPSULE | Freq: Three times a day (TID) | ORAL | Status: DC

## 2014-01-19 ENCOUNTER — Ambulatory Visit (HOSPITAL_BASED_OUTPATIENT_CLINIC_OR_DEPARTMENT_OTHER): Payer: 59 | Admitting: Hematology & Oncology

## 2014-01-19 ENCOUNTER — Other Ambulatory Visit (HOSPITAL_BASED_OUTPATIENT_CLINIC_OR_DEPARTMENT_OTHER): Payer: 59 | Admitting: Lab

## 2014-01-19 ENCOUNTER — Encounter: Payer: Self-pay | Admitting: Hematology & Oncology

## 2014-01-19 VITALS — BP 122/69 | HR 78 | Temp 98.0°F | Resp 14 | Ht 66.0 in | Wt 181.0 lb

## 2014-01-19 DIAGNOSIS — C50919 Malignant neoplasm of unspecified site of unspecified female breast: Secondary | ICD-10-CM

## 2014-01-19 DIAGNOSIS — M810 Age-related osteoporosis without current pathological fracture: Secondary | ICD-10-CM

## 2014-01-19 DIAGNOSIS — C50912 Malignant neoplasm of unspecified site of left female breast: Secondary | ICD-10-CM

## 2014-01-19 DIAGNOSIS — Z853 Personal history of malignant neoplasm of breast: Secondary | ICD-10-CM

## 2014-01-19 DIAGNOSIS — E559 Vitamin D deficiency, unspecified: Secondary | ICD-10-CM

## 2014-01-19 LAB — CBC WITH DIFFERENTIAL (CANCER CENTER ONLY)
BASO#: 0 10*3/uL (ref 0.0–0.2)
BASO%: 0.3 % (ref 0.0–2.0)
EOS ABS: 0.1 10*3/uL (ref 0.0–0.5)
EOS%: 1.1 % (ref 0.0–7.0)
HCT: 40.7 % (ref 34.8–46.6)
HGB: 13.2 g/dL (ref 11.6–15.9)
LYMPH#: 1.4 10*3/uL (ref 0.9–3.3)
LYMPH%: 20 % (ref 14.0–48.0)
MCH: 29.5 pg (ref 26.0–34.0)
MCHC: 32.4 g/dL (ref 32.0–36.0)
MCV: 91 fL (ref 81–101)
MONO#: 0.4 10*3/uL (ref 0.1–0.9)
MONO%: 6.1 % (ref 0.0–13.0)
NEUT%: 72.5 % (ref 39.6–80.0)
NEUTROS ABS: 5.1 10*3/uL (ref 1.5–6.5)
PLATELETS: 214 10*3/uL (ref 145–400)
RBC: 4.48 10*6/uL (ref 3.70–5.32)
RDW: 14.2 % (ref 11.1–15.7)
WBC: 7 10*3/uL (ref 3.9–10.0)

## 2014-01-19 NOTE — Progress Notes (Signed)
Hematology and Oncology Follow Up Visit  Selena Cruz [id number] 1945/12/25 68 y.o. 01/19/2014   Principle Diagnosis:  Stage I (T1 N0 M0) infiltrating ductal carcinoma of the left breast.  Current Therapy:   Observation.     Interim History:  Selena Cruz is is back for followup. We see her yearly. His been 15 years since she had treatment. She's doing well. Her main problem is reflux. She has a lot of reflux. She is seeing gastroenterology. She is on several medications now. She still is having problems with reflux.  Told her to stop the baby aspirin. This may help her all that. Otherwise, she is doing okay. She's had no bony pain. There is no change in bowel bladder habits. She's had no bleeding. Said no leg swelling. There's been no rashes.  Her last mammogram was in November of 2014. This was normal.  Medications: Current outpatient prescriptions:aspirin 81 MG tablet, Take 81 mg by mouth daily., Disp: , Rfl: ;  atorvastatin (LIPITOR) 20 MG tablet, Take 20 mg by mouth daily., Disp: , Rfl: ;  Cetirizine HCl (ZYRTEC ALLERGY PO), Take by mouth every morning., Disp: , Rfl: ;  cholecalciferol (VITAMIN D) 1000 UNITS tablet, Take 1,000 Units by mouth daily., Disp: , Rfl:  esomeprazole (NEXIUM) 40 MG capsule, Take 40 mg by mouth daily before breakfast., Disp: , Rfl: ;  Flaxseed, Linseed, (FLAX SEEDS PO), Take by mouth every morning., Disp: , Rfl: ;  Multiple Vitamin (MULTI-VITAMIN DAILY PO), Take by mouth every morning., Disp: , Rfl: ;  ranitidine (ZANTAC) 150 MG tablet, Take 150 mg by mouth at bedtime., Disp: , Rfl: ;  telmisartan (MICARDIS) 40 MG tablet, Take 40 mg by mouth daily., Disp: , Rfl:   Allergies: No Known Allergies  Past Medical History, Surgical history, Social history, and Family History were reviewed and updated.  Review of Systems: As above  Physical Exam:  height is 5\' 6"  (1.676 m) and weight is 181 lb (82.101 kg). Her oral temperature is 98 F (36.7 C). Her blood  pressure is 122/69 and her pulse is 78. Her respiration is 14.   Well-developed and well-nourished white female. Lungs are clear. Neck shows no adenopathy. Cardiac exam regular in rhythm with no murmurs rubs or bruits. Breasts exam shows a right breast no masses edema or erythema. There is no right axillary adenopathy. Left breast is slightly contracted from radiation. His well-healed lumpectomy at the low opposition. Slight firmness is noted at the lumpectomy site. There is no left axillary adenopathy. Abdomen is soft. Positive bowel sounds. There is no fluid wave. There is no palpable liver or spleen tip. Back exam no kyphosis. No tenderness over the spine ribs or hips. Extremities shows no clubbing cyanosis or edema. Neurological exam shows no focal neurological deficits. Skin exam shows numerous seborrheic keratoses on her back.  Lab Results  Component Value Date   WBC 7.0 01/19/2014   HGB 13.2 01/19/2014   HCT 40.7 01/19/2014   MCV 91 01/19/2014   PLT 214 01/19/2014     Chemistry      Component Value Date/Time   NA 141 01/20/2013 0859   K 4.3 01/20/2013 0859   CL 103 01/20/2013 0859   CO2 30 01/20/2013 0859   BUN 21 01/20/2013 0859   CREATININE 0.97 01/20/2013 0859      Component Value Date/Time   CALCIUM 9.2 01/20/2013 0859   ALKPHOS 117 01/20/2013 0859   AST 18 01/20/2013 0859   ALT 19 01/20/2013  4665   BILITOT 0.4 01/20/2013 0859         Impression and Plan: Selena Cruz is a 68 year old white female with a history of stage I infiltrating ductal carcinoma the left breast. She was ER negative. She underwent adjuvant chemotherapy with 4 cycles of Cytoxan/Adriamycin. She had radiation.  I really think that she is cured. Given that she had your negative disease, I think the chance of recurrence at 15 years [place] less than 5%.  Choice to come by to see his yearly. We'll be more than happy to follow her along.   Volanda Napoleon, MD 4/9/201510:34 AM

## 2014-01-20 LAB — COMPREHENSIVE METABOLIC PANEL
ALBUMIN: 4 g/dL (ref 3.5–5.2)
ALT: 18 U/L (ref 0–35)
AST: 19 U/L (ref 0–37)
Alkaline Phosphatase: 111 U/L (ref 39–117)
BUN: 25 mg/dL — ABNORMAL HIGH (ref 6–23)
CALCIUM: 9.4 mg/dL (ref 8.4–10.5)
CHLORIDE: 106 meq/L (ref 96–112)
CO2: 29 meq/L (ref 19–32)
Creatinine, Ser: 0.98 mg/dL (ref 0.50–1.10)
GLUCOSE: 116 mg/dL — AB (ref 70–99)
POTASSIUM: 4.3 meq/L (ref 3.5–5.3)
SODIUM: 143 meq/L (ref 135–145)
TOTAL PROTEIN: 6.7 g/dL (ref 6.0–8.3)
Total Bilirubin: 0.4 mg/dL (ref 0.2–1.2)

## 2014-01-20 LAB — VITAMIN D 25 HYDROXY (VIT D DEFICIENCY, FRACTURES): VIT D 25 HYDROXY: 45 ng/mL (ref 30–89)

## 2014-01-23 ENCOUNTER — Encounter: Payer: Self-pay | Admitting: Nurse Practitioner

## 2014-07-28 ENCOUNTER — Other Ambulatory Visit: Payer: Self-pay

## 2014-08-02 ENCOUNTER — Encounter: Payer: Self-pay | Admitting: Hematology & Oncology

## 2014-08-04 ENCOUNTER — Other Ambulatory Visit: Payer: Self-pay

## 2014-08-04 DIAGNOSIS — Z1231 Encounter for screening mammogram for malignant neoplasm of breast: Secondary | ICD-10-CM

## 2014-08-24 ENCOUNTER — Ambulatory Visit
Admission: RE | Admit: 2014-08-24 | Discharge: 2014-08-24 | Disposition: A | Discharge status: Home / Self Care | Payer: 59 | Source: Ambulatory Visit

## 2014-08-24 DIAGNOSIS — Z1231 Encounter for screening mammogram for malignant neoplasm of breast: Secondary | ICD-10-CM

## 2015-01-18 ENCOUNTER — Ambulatory Visit: Payer: 59 | Admitting: Hematology & Oncology

## 2015-01-18 ENCOUNTER — Other Ambulatory Visit: Payer: 59 | Admitting: Lab

## 2015-01-30 ENCOUNTER — Ambulatory Visit: Payer: Self-pay | Admitting: Hematology & Oncology

## 2015-01-30 ENCOUNTER — Other Ambulatory Visit: Payer: 59

## 2015-02-19 ENCOUNTER — Ambulatory Visit (HOSPITAL_BASED_OUTPATIENT_CLINIC_OR_DEPARTMENT_OTHER): Payer: 59 | Admitting: Hematology & Oncology

## 2015-02-19 ENCOUNTER — Encounter: Payer: Self-pay | Admitting: Hematology & Oncology

## 2015-02-19 ENCOUNTER — Other Ambulatory Visit (HOSPITAL_BASED_OUTPATIENT_CLINIC_OR_DEPARTMENT_OTHER): Payer: BC Managed Care – PPO

## 2015-02-19 ENCOUNTER — Other Ambulatory Visit: Payer: Self-pay | Admitting: *Deleted

## 2015-02-19 VITALS — BP 138/79 | HR 72 | Temp 97.8°F | Resp 14 | Ht 66.0 in | Wt 178.0 lb

## 2015-02-19 DIAGNOSIS — N952 Postmenopausal atrophic vaginitis: Secondary | ICD-10-CM

## 2015-02-19 DIAGNOSIS — Z853 Personal history of malignant neoplasm of breast: Secondary | ICD-10-CM

## 2015-02-19 DIAGNOSIS — C50919 Malignant neoplasm of unspecified site of unspecified female breast: Secondary | ICD-10-CM

## 2015-02-19 DIAGNOSIS — M810 Age-related osteoporosis without current pathological fracture: Secondary | ICD-10-CM

## 2015-02-19 LAB — CBC WITH DIFFERENTIAL (CANCER CENTER ONLY)
BASO#: 0 10*3/uL (ref 0.0–0.2)
BASO%: 0.3 % (ref 0.0–2.0)
EOS%: 1.5 % (ref 0.0–7.0)
Eosinophils Absolute: 0.1 10*3/uL (ref 0.0–0.5)
HCT: 41.9 % (ref 34.8–46.6)
HGB: 13.8 g/dL (ref 11.6–15.9)
LYMPH#: 1.5 10*3/uL (ref 0.9–3.3)
LYMPH%: 24 % (ref 14.0–48.0)
MCH: 29.4 pg (ref 26.0–34.0)
MCHC: 32.9 g/dL (ref 32.0–36.0)
MCV: 89 fL (ref 81–101)
MONO#: 0.3 10*3/uL (ref 0.1–0.9)
MONO%: 4.8 % (ref 0.0–13.0)
NEUT%: 69.4 % (ref 39.6–80.0)
NEUTROS ABS: 4.3 10*3/uL (ref 1.5–6.5)
PLATELETS: 220 10*3/uL (ref 145–400)
RBC: 4.7 10*6/uL (ref 3.70–5.32)
RDW: 13.7 % (ref 11.1–15.7)
WBC: 6.2 10*3/uL (ref 3.9–10.0)

## 2015-02-19 MED ORDER — ESTRADIOL 0.1 MG/GM VA CREA: 1.0000 | TOPICAL_CREAM | Freq: Every day | VAGINAL | Status: DC

## 2015-02-19 NOTE — Progress Notes (Signed)
Hematology and Oncology Follow Up Visit  Selena Cruz [id number] Oct 17, 1945 69 y.o. 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  Current Therapy:   Observation.     Interim History:  Ms.  Cruz is is back for followup. We see her yearly. His been 16 years since she had treatment. She's doing well. Her sister is not doing all that well. I see her because of a history of melanoma and ductal carcinoma in situ. However, her problem is that she has severe emphysema and is getting worse. Her pulmonologist is living saw gave her sister and able to good pulmonologist who I think will help.  Selena Cruz has had no problems with bowels or bladder. She has had no rashes. She's had no leg swelling. She's had no cough. She's had no headache.   Overall, her performance status is ECOG 0 Medications:  Current outpatient prescriptions:  .  aspirin 81 MG tablet, Take 81 mg by mouth daily., Disp: , Rfl:  .  atorvastatin (LIPITOR) 20 MG tablet, Take 20 mg by mouth daily., Disp: , Rfl:  .  Cetirizine HCl (ZYRTEC ALLERGY PO), Take by mouth every morning., Disp: , Rfl:  .  cholecalciferol (VITAMIN D) 1000 UNITS tablet, Take 1,000 Units by mouth daily., Disp: , Rfl:  .  Multiple Vitamin (MULTI-VITAMIN DAILY PO), Take by mouth every morning., Disp: , Rfl:  .  telmisartan (MICARDIS) 40 MG tablet, Take 40 mg by mouth daily., Disp: , Rfl:  .  UNABLE TO FIND, Take by mouth every morning. ZEGRID, Disp: , Rfl:  .  estradiol (ESTRACE) 0.1 MG/GM vaginal cream, Place 1 Applicatorful vaginally at bedtime., Disp: 42.5 g, Rfl: 12  Allergies: No Known Allergies  Past Medical History, Surgical history, Social history, and Family History were reviewed and updated.  Review of Systems: As above  Physical Exam:  height is 5\' 6"  (1.676 m) and weight is 178 lb (80.74 kg). Her oral temperature is 97.8 F (36.6 C). Her blood pressure is 138/79 and her pulse  is 72. Her respiration is 14.   Well-developed and well-nourished white female. Lungs are clear. Neck shows no adenopathy. Cardiac exam regular rate and in rhythm with no murmurs rubs or bruits. Breasts exam shows a right breast no masses edema or erythema. There is no right axillary adenopathy. Left breast is slightly contracted from radiation. His well-healed lumpectomy at the low opposition. Slight firmness is noted at the lumpectomy site. There is no left axillary adenopathy. Abdomen is soft. Positive bowel sounds. There is no fluid wave. There is no palpable liver or spleen tip. Back exam no kyphosis. No tenderness over the spine ribs or hips. Extremities shows no clubbing cyanosis or edema. Neurological exam shows no focal neurological deficits. Skin exam shows numerous seborrheic keratoses on her back.  Lab Results  Component Value Date   WBC 6.2 02/19/2015   HGB 13.8 02/19/2015   HCT 41.9 02/19/2015   MCV 89 02/19/2015   PLT 220 02/19/2015     Chemistry      Component Value Date/Time   NA 143 01/19/2014 0855   K 4.3 01/19/2014 0855   CL 106 01/19/2014 0855   CO2 29 01/19/2014 0855   BUN 25* 01/19/2014 0855   CREATININE 0.98 01/19/2014 0855      Component Value Date/Time   CALCIUM 9.4 01/19/2014 0855   ALKPHOS 111 01/19/2014 0855   AST 19 01/19/2014 0855  ALT 18 01/19/2014 0855   BILITOT 0.4 01/19/2014 0855         Impression and Plan: Selena Cruz is a 66 white female with a history of stage I infiltrating ductal carcinoma the left breast. She was ER negative. She underwent adjuvant chemotherapy with 4 cycles of Cytoxan/Adriamycin. She had radiation.  I really think that she is cured. Given that she had ER negative disease, I think the chance of recurrence at 15 years is probably less than 5%.  She likes to come by to see [place] yearly. We'll be more than happy to follow her along.   Volanda Napoleon, MD 5/9/201610:34 AM

## 2015-02-20 ENCOUNTER — Telehealth: Payer: Self-pay | Admitting: *Deleted

## 2015-02-20 LAB — COMPREHENSIVE METABOLIC PANEL WITH GFR
ALT: 17 U/L (ref 0–35)
AST: 19 U/L (ref 0–37)
Albumin: 3.9 g/dL (ref 3.5–5.2)
Alkaline Phosphatase: 112 U/L (ref 39–117)
BUN: 20 mg/dL (ref 6–23)
CO2: 28 meq/L (ref 19–32)
Calcium: 9.4 mg/dL (ref 8.4–10.5)
Chloride: 104 meq/L (ref 96–112)
Creatinine, Ser: 0.96 mg/dL (ref 0.50–1.10)
Glucose, Bld: 118 mg/dL — ABNORMAL HIGH (ref 70–99)
Potassium: 3.9 meq/L (ref 3.5–5.3)
Sodium: 142 meq/L (ref 135–145)
Total Bilirubin: 0.4 mg/dL (ref 0.2–1.2)
Total Protein: 6.8 g/dL (ref 6.0–8.3)

## 2015-02-20 LAB — VITAMIN D 25 HYDROXY (VIT D DEFICIENCY, FRACTURES): Vit D, 25-Hydroxy: 63 ng/mL (ref 30–100)

## 2015-02-20 LAB — LACTATE DEHYDROGENASE: LDH: 171 U/L (ref 94–250)

## 2015-02-20 NOTE — Telephone Encounter (Addendum)
Patient aware of results.   ----- Message from Volanda Napoleon, MD sent at 02/19/2015  6:31 PM EDT ----- Please call and let her know that her labs look okay. Thanks.

## 2015-04-09 ENCOUNTER — Other Ambulatory Visit: Payer: Self-pay

## 2015-09-13 ENCOUNTER — Other Ambulatory Visit: Payer: Self-pay

## 2015-09-13 DIAGNOSIS — Z1231 Encounter for screening mammogram for malignant neoplasm of breast: Secondary | ICD-10-CM

## 2015-10-18 ENCOUNTER — Ambulatory Visit
Admission: RE | Admit: 2015-10-18 | Discharge: 2015-10-18 | Disposition: A | Discharge status: Home / Self Care | Payer: 59 | Source: Ambulatory Visit

## 2015-10-18 DIAGNOSIS — Z1231 Encounter for screening mammogram for malignant neoplasm of breast: Secondary | ICD-10-CM

## 2015-10-19 ENCOUNTER — Other Ambulatory Visit: Payer: Self-pay | Admitting: Hematology & Oncology

## 2015-10-19 DIAGNOSIS — R928 Other abnormal and inconclusive findings on diagnostic imaging of breast: Secondary | ICD-10-CM

## 2015-11-01 ENCOUNTER — Ambulatory Visit
Admission: RE | Admit: 2015-11-01 | Discharge: 2015-11-01 | Disposition: A | Discharge status: Home / Self Care | Payer: 59 | Source: Ambulatory Visit | Attending: Hematology & Oncology | Admitting: Hematology & Oncology

## 2015-11-01 DIAGNOSIS — R928 Other abnormal and inconclusive findings on diagnostic imaging of breast: Secondary | ICD-10-CM

## 2016-02-14 ENCOUNTER — Encounter: Payer: Self-pay | Admitting: Hematology & Oncology

## 2016-02-18 ENCOUNTER — Encounter: Payer: Self-pay | Admitting: Hematology & Oncology

## 2016-02-18 ENCOUNTER — Other Ambulatory Visit (HOSPITAL_BASED_OUTPATIENT_CLINIC_OR_DEPARTMENT_OTHER): Payer: 59

## 2016-02-18 ENCOUNTER — Ambulatory Visit (HOSPITAL_BASED_OUTPATIENT_CLINIC_OR_DEPARTMENT_OTHER): Payer: 59 | Admitting: Hematology & Oncology

## 2016-02-18 VITALS — BP 112/66 | HR 69 | Temp 97.6°F | Resp 16 | Ht 66.0 in | Wt 182.0 lb

## 2016-02-18 DIAGNOSIS — N952 Postmenopausal atrophic vaginitis: Secondary | ICD-10-CM

## 2016-02-18 DIAGNOSIS — Z171 Estrogen receptor negative status [ER-]: Secondary | ICD-10-CM

## 2016-02-18 DIAGNOSIS — Z853 Personal history of malignant neoplasm of breast: Secondary | ICD-10-CM

## 2016-02-18 DIAGNOSIS — M810 Age-related osteoporosis without current pathological fracture: Secondary | ICD-10-CM

## 2016-02-18 DIAGNOSIS — C50911 Malignant neoplasm of unspecified site of right female breast: Secondary | ICD-10-CM

## 2016-02-18 LAB — CBC WITH DIFFERENTIAL (CANCER CENTER ONLY)
BASO#: 0 10*3/uL (ref 0.0–0.2)
BASO%: 0.3 % (ref 0.0–2.0)
EOS%: 1.6 % (ref 0.0–7.0)
Eosinophils Absolute: 0.1 10*3/uL (ref 0.0–0.5)
HCT: 40.5 % (ref 34.8–46.6)
HEMOGLOBIN: 13.4 g/dL (ref 11.6–15.9)
LYMPH#: 1.5 10*3/uL (ref 0.9–3.3)
LYMPH%: 20 % (ref 14.0–48.0)
MCH: 29.3 pg (ref 26.0–34.0)
MCHC: 33.1 g/dL (ref 32.0–36.0)
MCV: 88 fL (ref 81–101)
MONO#: 0.4 10*3/uL (ref 0.1–0.9)
MONO%: 5.8 % (ref 0.0–13.0)
NEUT%: 72.3 % (ref 39.6–80.0)
NEUTROS ABS: 5.4 10*3/uL (ref 1.5–6.5)
Platelets: 225 10*3/uL (ref 145–400)
RBC: 4.58 10*6/uL (ref 3.70–5.32)
RDW: 13.2 % (ref 11.1–15.7)
WBC: 7.5 10*3/uL (ref 3.9–10.0)

## 2016-02-18 LAB — COMPREHENSIVE METABOLIC PANEL
ALBUMIN: 3.4 g/dL — AB (ref 3.5–5.0)
ALK PHOS: 110 U/L (ref 40–150)
ALT: 21 U/L (ref 0–55)
AST: 20 U/L (ref 5–34)
Anion Gap: 7 mEq/L (ref 3–11)
BUN: 18.4 mg/dL (ref 7.0–26.0)
CHLORIDE: 106 meq/L (ref 98–109)
CO2: 28 mEq/L (ref 22–29)
Calcium: 9.4 mg/dL (ref 8.4–10.4)
Creatinine: 0.9 mg/dL (ref 0.6–1.1)
EGFR: 67 mL/min/{1.73_m2} — AB (ref >90)
GLUCOSE: 97 mg/dL (ref 70–140)
POTASSIUM: 3.8 meq/L (ref 3.5–5.1)
Sodium: 142 mEq/L (ref 136–145)
Total Bilirubin: 0.39 mg/dL (ref 0.20–1.20)
Total Protein: 6.9 g/dL (ref 6.4–8.3)

## 2016-02-18 NOTE — Progress Notes (Signed)
Hematology and Oncology Follow Up Visit  Selena Cruz [id number] [birth year] 70 y.o. 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  Current Therapy:   Observation.     Interim History:  Selena Cruz is is back for followup. We see her yearly. His been 18 years since she had treatment.   She recently had a mammogram done. There is some issues with the mammogram. She apparently had a problem with the right breast. Biopsies were done but thankfully, there is no evidence of any malignancy. She is a was had cysts.  Her sister is in a nursing home now area and her COPD is getting the best of her. She has had issues with pneumonia.  Otherwise, Selena Cruz is doing well. She has had no change in bowel or bladder habits. She has had no issues with cough. She's had no rashes. She's had no nausea or vomiting. She's had no leg swelling. She does have some arthritic issues but this is chronic.  Overall, her performance status is ECOG 0.     Allergies: No Known Allergies  Past Medical History, Surgical history, Social history, and Family History were reviewed and updated.  Review of Systems: As above  Physical Exam:  height is 5\' 6"  (1.676 m) and weight is 182 lb (82.555 kg). Her oral temperature is 97.6 F (36.4 C). Her blood pressure is 112/66 and her pulse is 69. Her respiration is 16.   Well-developed and well-nourished white female. Lungs are clear. Neck shows no adenopathy. Cardiac exam regular rate and  rhythm with no murmurs rubs or bruits. Breasts exam shows a right breast no masses edema or erythema. There is no right axillary adenopathy. Left breast is slightly contracted from radiation. His well-healed lumpectomy at the low opposition. Slight firmness is noted at the lumpectomy site. There is no left axillary adenopathy. Abdomen is soft. Positive bowel sounds. There is no fluid wave. There is no palpable liver or spleen  tip. Back exam no kyphosis. No tenderness over the spine ribs or hips. Extremities shows no clubbing cyanosis or edema. Neurological exam shows no focal neurological deficits. Skin exam shows numerous seborrheic keratoses on her back.  Lab Results  Component Value Date   WBC 7.5 02/18/2016   HGB 13.4 02/18/2016   HCT 40.5 02/18/2016   MCV 88 02/18/2016   PLT 225 02/18/2016     Chemistry      Component Value Date/Time   NA 142 02/19/2015 0838   K 3.9 02/19/2015 0838   CL 104 02/19/2015 0838   CO2 28 02/19/2015 0838   BUN 20 02/19/2015 0838   CREATININE 0.96 02/19/2015 0838      Component Value Date/Time   CALCIUM 9.4 02/19/2015 0838   ALKPHOS 112 02/19/2015 0838   AST 19 02/19/2015 0838   ALT 17 02/19/2015 0838   BILITOT 0.4 02/19/2015 0838         Impression and Plan: Selena Cruz is a 70 white female with a history of stage I infiltrating ductal carcinoma the left breast. She was ER negative. She underwent adjuvant chemotherapy with 4 cycles of Cytoxan/Adriamycin. She had radiation.  I really think that she is cured. Given that she had ER negative disease, I think the chance of recurrence at 18 years is probably less than 5%.  She likes to come by to see [place] yearly. We'll be more than happy to follow her along.   ENNEVER,PETER  R, MD 5/8/201711:21 AM

## 2016-02-19 LAB — VITAMIN D 25 HYDROXY (VIT D DEFICIENCY, FRACTURES): VIT D 25 HYDROXY: 64.7 ng/mL (ref 30.0–100.0)

## 2016-08-26 ENCOUNTER — Other Ambulatory Visit: Payer: Self-pay | Admitting: Internal Medicine

## 2016-08-26 DIAGNOSIS — D1803 Hemangioma of intra-abdominal structures: Secondary | ICD-10-CM

## 2016-09-09 ENCOUNTER — Ambulatory Visit
Admission: RE | Admit: 2016-09-09 | Discharge: 2016-09-09 | Disposition: A | Discharge status: Home / Self Care | Payer: 59 | Source: Ambulatory Visit | Attending: Internal Medicine | Admitting: Internal Medicine

## 2016-09-09 DIAGNOSIS — D1803 Hemangioma of intra-abdominal structures: Secondary | ICD-10-CM

## 2016-10-21 ENCOUNTER — Other Ambulatory Visit: Payer: Self-pay | Admitting: Internal Medicine

## 2016-10-21 DIAGNOSIS — Z1231 Encounter for screening mammogram for malignant neoplasm of breast: Secondary | ICD-10-CM

## 2016-11-27 ENCOUNTER — Ambulatory Visit
Admission: RE | Admit: 2016-11-27 | Discharge: 2016-11-27 | Disposition: A | Discharge status: Home / Self Care | Payer: 59 | Source: Ambulatory Visit | Attending: Internal Medicine | Admitting: Internal Medicine

## 2016-11-27 DIAGNOSIS — Z1231 Encounter for screening mammogram for malignant neoplasm of breast: Secondary | ICD-10-CM

## 2017-02-27 ENCOUNTER — Other Ambulatory Visit: Payer: Self-pay | Admitting: *Deleted

## 2017-02-27 DIAGNOSIS — C50919 Malignant neoplasm of unspecified site of unspecified female breast: Secondary | ICD-10-CM

## 2017-03-02 ENCOUNTER — Ambulatory Visit (HOSPITAL_BASED_OUTPATIENT_CLINIC_OR_DEPARTMENT_OTHER): Payer: 59 | Admitting: Hematology & Oncology

## 2017-03-02 ENCOUNTER — Other Ambulatory Visit (HOSPITAL_BASED_OUTPATIENT_CLINIC_OR_DEPARTMENT_OTHER): Payer: 59

## 2017-03-02 VITALS — BP 132/88 | HR 75 | Temp 98.0°F | Resp 16 | Wt 187.0 lb

## 2017-03-02 DIAGNOSIS — Z853 Personal history of malignant neoplasm of breast: Secondary | ICD-10-CM

## 2017-03-02 DIAGNOSIS — Z171 Estrogen receptor negative status [ER-]: Secondary | ICD-10-CM

## 2017-03-02 DIAGNOSIS — M818 Other osteoporosis without current pathological fracture: Secondary | ICD-10-CM

## 2017-03-02 DIAGNOSIS — C50919 Malignant neoplasm of unspecified site of unspecified female breast: Secondary | ICD-10-CM

## 2017-03-02 LAB — COMPREHENSIVE METABOLIC PANEL
ALT: 29 U/L (ref 0–55)
ANION GAP: 8 meq/L (ref 3–11)
AST: 27 U/L (ref 5–34)
Albumin: 3.5 g/dL (ref 3.5–5.0)
Alkaline Phosphatase: 132 U/L (ref 40–150)
BUN: 18 mg/dL (ref 7.0–26.0)
CO2: 30 meq/L — AB (ref 22–29)
Calcium: 9.7 mg/dL (ref 8.4–10.4)
Chloride: 105 mEq/L (ref 98–109)
Creatinine: 0.9 mg/dL (ref 0.6–1.1)
EGFR: 65 mL/min/{1.73_m2} — AB (ref >90)
Glucose: 97 mg/dl (ref 70–140)
Potassium: 4.6 mEq/L (ref 3.5–5.1)
Sodium: 143 mEq/L (ref 136–145)
Total Bilirubin: 0.52 mg/dL (ref 0.20–1.20)
Total Protein: 7.1 g/dL (ref 6.4–8.3)

## 2017-03-02 LAB — CBC WITH DIFFERENTIAL (CANCER CENTER ONLY)
BASO#: 0 10*3/uL (ref 0.0–0.2)
BASO%: 0.3 % (ref 0.0–2.0)
EOS%: 1.2 % (ref 0.0–7.0)
Eosinophils Absolute: 0.1 10*3/uL (ref 0.0–0.5)
HCT: 40.6 % (ref 34.8–46.6)
HGB: 13 g/dL (ref 11.6–15.9)
LYMPH#: 1.4 10*3/uL (ref 0.9–3.3)
LYMPH%: 19.9 % (ref 14.0–48.0)
MCH: 28.5 pg (ref 26.0–34.0)
MCHC: 32 g/dL (ref 32.0–36.0)
MCV: 89 fL (ref 81–101)
MONO#: 0.4 10*3/uL (ref 0.1–0.9)
MONO%: 6 % (ref 0.0–13.0)
NEUT#: 4.9 10*3/uL (ref 1.5–6.5)
NEUT%: 72.6 % (ref 39.6–80.0)
PLATELETS: 213 10*3/uL (ref 145–400)
RBC: 4.56 10*6/uL (ref 3.70–5.32)
RDW: 13.9 % (ref 11.1–15.7)
WBC: 6.8 10*3/uL (ref 3.9–10.0)

## 2017-03-02 NOTE — Progress Notes (Signed)
Hematology and Oncology Follow Up Visit  Selena Cruz [id number] May 20, 1946 71 y.o. 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  Current Therapy:   Observation.     Interim History:  Ms.  Cruz is is back for followup. We see her yearly. His been 19 years since she had treatment.   She is quite busy with her family. Her older sister has terrible COPD. She is in a nursing home. A younger brother also now has COPD. He is in the hospital with pneumonia.  Her husband is still working. He is working at the same office for 50 years.  She had a mammogram back in February. Mammogram looked great.  She's had no cough or shortness of breath. She's had no change in bowel or bladder habits. She's had no nausea or vomiting. There's been no leg swelling. She's had no rashes.   Overall, her performance status is ECOG 0.   Allergies: No Known Allergies  Past Medical History, Surgical history, Social history, and Family History were reviewed and updated.  Review of Systems: As above  Physical Exam:  weight is 187 lb (84.8 kg). Her oral temperature is 98 F (36.7 C). Her blood pressure is 132/88 and her pulse is 75. Her respiration is 16 and oxygen saturation is 95%.   Well-developed and well-nourished white female. Lungs are clear. Neck shows no adenopathy. Cardiac exam regular rate and  rhythm with no murmurs rubs or bruits. Breasts exam shows a right breast no masses edema or erythema. There is no right axillary adenopathy. Left breast is slightly contracted from radiation. There is a well-healed lumpectomy at the 11:00 position. Slight firmness is noted at the lumpectomy site. There is no left axillary adenopathy. Abdomen is soft. She has good bowel sounds. There is no fluid wave. There is no palpable liver or spleen tip. Back exam no kyphosis. No tenderness over the spine ribs or hips. Extremities shows no clubbing cyanosis  or edema. Neurological exam shows no focal neurological deficits. Skin exam shows numerous seborrheic keratoses on her back.  Lab Results  Component Value Date   WBC 6.8 03/02/2017   HGB 13.0 03/02/2017   HCT 40.6 03/02/2017   MCV 89 03/02/2017   PLT 213 03/02/2017     Chemistry      Component Value Date/Time   NA 142 02/18/2016 1001   K 3.8 02/18/2016 1001   CL 104 02/19/2015 0838   CO2 28 02/18/2016 1001   BUN 18.4 02/18/2016 1001   CREATININE 0.9 02/18/2016 1001      Component Value Date/Time   CALCIUM 9.4 02/18/2016 1001   ALKPHOS 110 02/18/2016 1001   AST 20 02/18/2016 1001   ALT 21 02/18/2016 1001   BILITOT 0.39 02/18/2016 1001         Impression and Plan: Selena Cruz is a 71 yr old white female with a history of stage I infiltrating ductal carcinoma the left breast. She was ER negative. She underwent adjuvant chemotherapy with 4 cycles of Cytoxan/Adriamycin. She had radiation.  I really think that she is cured. Given that she had ER negative disease, I think the chance of recurrence at 19 years is probably less than 5%.  She likes to come by to see [place] yearly. We'll be more than happy to follow her along.   Volanda Napoleon, MD 5/21/201811:33 AM

## 2017-03-03 ENCOUNTER — Encounter: Payer: Self-pay | Admitting: *Deleted

## 2017-03-03 LAB — VITAMIN D 25 HYDROXY (VIT D DEFICIENCY, FRACTURES): VIT D 25 HYDROXY: 60.7 ng/mL (ref 30.0–100.0)

## 2017-04-27 ENCOUNTER — Emergency Department (HOSPITAL_COMMUNITY): Payer: 59

## 2017-04-27 ENCOUNTER — Emergency Department (HOSPITAL_COMMUNITY)
Admission: EM | Admit: 2017-04-27 | Discharge: 2017-04-27 | Disposition: A | Discharge status: Home / Self Care | Payer: 59 | Attending: Emergency Medicine | Admitting: Emergency Medicine

## 2017-04-27 ENCOUNTER — Encounter (HOSPITAL_COMMUNITY): Payer: Self-pay | Admitting: *Deleted

## 2017-04-27 DIAGNOSIS — R112 Nausea with vomiting, unspecified: Secondary | ICD-10-CM

## 2017-04-27 DIAGNOSIS — Z79899 Other long term (current) drug therapy: Secondary | ICD-10-CM | POA: Insufficient documentation

## 2017-04-27 DIAGNOSIS — Z853 Personal history of malignant neoplasm of breast: Secondary | ICD-10-CM | POA: Diagnosis not present

## 2017-04-27 DIAGNOSIS — Z7982 Long term (current) use of aspirin: Secondary | ICD-10-CM | POA: Insufficient documentation

## 2017-04-27 DIAGNOSIS — I10 Essential (primary) hypertension: Secondary | ICD-10-CM | POA: Insufficient documentation

## 2017-04-27 DIAGNOSIS — R197 Diarrhea, unspecified: Secondary | ICD-10-CM

## 2017-04-27 DIAGNOSIS — E860 Dehydration: Secondary | ICD-10-CM

## 2017-04-27 DIAGNOSIS — Z87891 Personal history of nicotine dependence: Secondary | ICD-10-CM | POA: Insufficient documentation

## 2017-04-27 LAB — URINALYSIS, ROUTINE W REFLEX MICROSCOPIC
Bilirubin Urine: NEGATIVE
GLUCOSE, UA: NEGATIVE mg/dL
KETONES UR: NEGATIVE mg/dL
Leukocytes, UA: NEGATIVE
Nitrite: NEGATIVE
PROTEIN: 30 mg/dL — AB
Specific Gravity, Urine: 1.027 (ref 1.005–1.030)
pH: 5 (ref 5.0–8.0)

## 2017-04-27 LAB — LIPASE, BLOOD: LIPASE: 19 U/L (ref 11–51)

## 2017-04-27 LAB — COMPREHENSIVE METABOLIC PANEL
ALBUMIN: 3.4 g/dL — AB (ref 3.5–5.0)
ALK PHOS: 114 U/L (ref 38–126)
ALT: 30 U/L (ref 14–54)
ANION GAP: 10 (ref 5–15)
AST: 34 U/L (ref 15–41)
BUN: 17 mg/dL (ref 6–20)
CALCIUM: 8.7 mg/dL — AB (ref 8.9–10.3)
CO2: 26 mmol/L (ref 22–32)
CREATININE: 1.1 mg/dL — AB (ref 0.44–1.00)
Chloride: 98 mmol/L — ABNORMAL LOW (ref 101–111)
GFR calc Af Amer: 56 mL/min — ABNORMAL LOW (ref >60)
GFR calc non Af Amer: 49 mL/min — ABNORMAL LOW (ref >60)
GLUCOSE: 118 mg/dL — AB (ref 65–99)
Potassium: 3.9 mmol/L (ref 3.5–5.1)
SODIUM: 134 mmol/L — AB (ref 135–145)
Total Bilirubin: 0.5 mg/dL (ref 0.3–1.2)
Total Protein: 7.3 g/dL (ref 6.5–8.1)

## 2017-04-27 LAB — CBC
HCT: 39.8 % (ref 36.0–46.0)
HEMOGLOBIN: 12.8 g/dL (ref 12.0–15.0)
MCH: 28.4 pg (ref 26.0–34.0)
MCHC: 32.2 g/dL (ref 30.0–36.0)
MCV: 88.2 fL (ref 78.0–100.0)
Platelets: 183 10*3/uL (ref 150–400)
RBC: 4.51 MIL/uL (ref 3.87–5.11)
RDW: 14.5 % (ref 11.5–15.5)
WBC: 9 10*3/uL (ref 4.0–10.5)

## 2017-04-27 MED ORDER — ONDANSETRON HCL 4 MG PO TABS: 4.0000 mg | ORAL_TABLET | Freq: Three times a day (TID) | ORAL | 0 refills | Status: DC | PRN

## 2017-04-27 MED ORDER — ALBUTEROL SULFATE HFA 108 (90 BASE) MCG/ACT IN AERS
2.0000 | INHALATION_SPRAY | Freq: Four times a day (QID) | RESPIRATORY_TRACT | Status: DC | PRN
  Administered 2017-04-27: 2 via RESPIRATORY_TRACT
  Filled 2017-04-27: qty 6.7

## 2017-04-27 MED ORDER — ONDANSETRON HCL 4 MG/2ML IJ SOLN
4.0000 mg | Freq: Once | INTRAMUSCULAR | Status: AC
  Administered 2017-04-27: 4 mg via INTRAVENOUS
  Filled 2017-04-27: qty 2

## 2017-04-27 MED ORDER — SODIUM CHLORIDE 0.9 % IV BOLUS (SEPSIS)
1000.0000 mL | Freq: Once | INTRAVENOUS | Status: AC
  Administered 2017-04-27: 1000 mL via INTRAVENOUS

## 2017-04-27 MED ORDER — IPRATROPIUM-ALBUTEROL 0.5-2.5 (3) MG/3ML IN SOLN
3.0000 mL | Freq: Once | RESPIRATORY_TRACT | Status: AC
  Administered 2017-04-27: 3 mL via RESPIRATORY_TRACT
  Filled 2017-04-27: qty 3

## 2017-04-27 MED ORDER — PREDNISONE 20 MG PO TABS
60.0000 mg | ORAL_TABLET | Freq: Once | ORAL | Status: AC
  Administered 2017-04-27: 60 mg via ORAL
  Filled 2017-04-27: qty 3

## 2017-04-27 MED ORDER — BENZONATATE 100 MG PO CAPS
100.0000 mg | ORAL_CAPSULE | Freq: Once | ORAL | Status: AC
  Administered 2017-04-27: 100 mg via ORAL
  Filled 2017-04-27: qty 1

## 2017-04-27 NOTE — ED Provider Notes (Signed)
[facility] DEPT Provider Note   CSN: [id number] Arrival date & time: 04/27/17  1058     History   Chief Complaint Chief Complaint  Patient presents with  . Nausea  . Emesis    HPI Selena Cruz is a 71 y.o. female.  The history is provided by the patient.  Illness  This is a new problem. The current episode started more than 2 days ago. The problem occurs constantly. The problem has been gradually worsening. Pertinent negatives include no chest pain, no headaches and no shortness of breath. Associated symptoms comments: Nausea, vomiting, diarrhea, cough, congestion.. Nothing aggravates the symptoms. Nothing relieves the symptoms. She has tried acetaminophen and rest for the symptoms.    Past Medical History:  Diagnosis Date  . [facility])    breast CA  . GERD (gastroesophageal reflux disease)   . Hiatal hernia   . Hypertension     There are no active problems to display for this patient.   Past Surgical History:  Procedure Laterality Date  . ABDOMINAL HYSTERECTOMY    . BRAVO [place] STUDY N/A 01/31/2013   Procedure: BRAVO [place];  Surgeon: Arta Silence, MD;  Location: [facility] ENDOSCOPY;  Service: Endoscopy;  Laterality: N/A;  . BREAST EXCISIONAL BIOPSY Right 1991  . COLONOSCOPY    . ESOPHAGOGASTRODUODENOSCOPY N/A 01/31/2013   Procedure: ESOPHAGOGASTRODUODENOSCOPY (EGD);  Surgeon: Arta Silence, MD;  Location: [facility] ENDOS[facility];  Service: Endoscopy;  Laterality: N/A;  . EXCISION / BIOPSY BREAST / NIPPLE / DUCT Left 1999  . MASTECTOMY     partial on left  . ROTATOR CUFF REPAIR      OB History    No data available       Home Medications    Prior to Admission medications   Medication Sig Start Date End Date Taking? Authorizing Provider  aspirin 81 MG tablet Take 81 mg by mouth daily.    [provider]  atorvastatin (LIPITOR) 20 MG tablet Take 20 mg by mouth daily.    [provider]  estradiol (ESTRACE) 0.1 MG/GM vaginal cream Place 1  Applicatorful vaginally at bedtime. 02/19/15   Volanda Napoleon, MD  Multiple Vitamin (MULTI-VITAMIN DAILY PO) Take by mouth every morning.    [provider]  ondansetron (ZOFRAN) 4 MG tablet Take 1 tablet (4 mg total) by mouth every 8 (eight) hours as needed for nausea or vomiting. 04/27/17   Valda Lamb, MD  telmisartan (MICARDIS) 40 MG tablet Take 40 mg by mouth daily.    [provider]  UNABLE TO FIND Take by mouth every morning. ZEGRID    [provider]  Vitamin D, Ergocalciferol, (DRISDOL) 50000 units CAPS capsule Take 50,000 Units by mouth daily.    [provider]    Family History Family History  Problem Relation Age of Onset  . Heart attack Mother   . Heart failure Father   . Breast cancer Sister   . Heart failure Brother   . CAD Brother   . Breast cancer Maternal Aunt     Social History Social History  Substance Use Topics  . Smoking status: Former Smoker    Packs/day: 1.00    Years: 50.00    Types: Cigarettes    Start date: 09/21/1959    Quit date: 09/21/2009  . Smokeless tobacco: Never Used     Comment: quit 4 years ago  . Alcohol use No     Allergies   Patient has no known allergies.   Review  of Systems Review of Systems  Constitutional: Negative for chills and fever.  HENT: Negative for ear pain and sore throat.   Eyes: Negative for pain and visual disturbance.  Respiratory: Positive for cough and wheezing. Negative for chest tightness and shortness of breath.   Cardiovascular: Negative for chest pain and palpitations.  Gastrointestinal: Positive for diarrhea, nausea and vomiting. Negative for abdominal distention, anal bleeding, blood in stool and constipation.  Endocrine: Negative for polyuria.  Genitourinary: Negative for dysuria and hematuria.  Musculoskeletal: Negative for arthralgias and back pain.  Skin: Negative for color change and rash.  Neurological: Negative for seizures, syncope and headaches.    Hematological: Does not bruise/bleed easily.  Psychiatric/Behavioral: Negative for agitation and behavioral problems.  All other systems reviewed and are negative.    Physical Exam Updated Vital Signs BP 125/62 (BP Location: Right Arm)   Pulse 95   Temp 98.7 F (37.1 C) (Oral)   Resp 16   SpO2 95%   Physical Exam  Constitutional: She is oriented to person, place, and time. She appears well-developed and well-nourished. No distress.  HENT:  Head: Normocephalic and atraumatic.  Mouth/Throat: Oropharynx is clear and moist.  Eyes: Pupils are equal, round, and reactive to light. Conjunctivae and EOM are normal.  Neck: Neck supple. No tracheal deviation present.  Cardiovascular: Normal rate and regular rhythm.   No murmur heard. Pulmonary/Chest: Effort normal. No respiratory distress. She has wheezes.  Coarse breath sounds bilaterally  Abdominal: Soft. She exhibits no distension. There is no tenderness. There is no rebound and no guarding.  Musculoskeletal: She exhibits no edema or deformity.  Neurological: She is alert and oriented to person, place, and time. No cranial nerve deficit.  Skin: Skin is warm and dry.  Psychiatric: She has a normal mood and affect.  Nursing note and vitals reviewed.    ED Treatments / Results  Labs (all labs ordered are listed, but only abnormal results are displayed) Labs Reviewed  COMPREHENSIVE METABOLIC PANEL - Abnormal; Notable for the following:       Result Value   Sodium 134 (*)    Chloride 98 (*)    Glucose, Bld 118 (*)    Creatinine, Ser 1.10 (*)    Calcium 8.7 (*)    Albumin 3.4 (*)    GFR calc non Af Amer 49 (*)    GFR calc Af Amer 56 (*)    All other components within normal limits  URINALYSIS, ROUTINE W REFLEX MICROSCOPIC - Abnormal; Notable for the following:    Color, Urine AMBER (*)    APPearance HAZY (*)    Hgb urine dipstick SMALL (*)    Protein, ur 30 (*)    Bacteria, UA RARE (*)    Squamous Epithelial / LPF 6-30 (*)     Non Squamous Epithelial 0-5 (*)    All other components within normal limits  CBC  LIPASE, BLOOD    EKG  EKG Interpretation None       Radiology Dg Chest 2 View  Result Date: 04/27/2017 CLINICAL DATA:  Cough, wheezing shortness breath EXAM: CHEST  2 VIEW COMPARISON:  09/24/2015 FINDINGS: The heart size and mediastinal contours are within normal limits. Both lungs are clear. The visualized skeletal structures are unremarkable. IMPRESSION: No active cardiopulmonary disease. Electronically Signed   By: Kathreen Devoid   On: 04/27/2017 12:10    Procedures Procedures (including critical care time)  Medications Ordered in ED Medications  sodium chloride 0.9 % bolus 1,000 mL (0  mLs Intravenous Stopped 04/27/17 1732)  ondansetron (ZOFRAN) injection 4 mg (4 mg Intravenous Given 04/27/17 1703)  benzonatate (TESSALON) capsule 100 mg (100 mg Oral Given 04/27/17 1735)  ipratropium-albuterol (DUONEB) 0.5-2.5 (3) MG/3ML nebulizer solution 3 mL (3 mLs Nebulization Given 04/27/17 1735)  predniSONE (DELTASONE) tablet 60 mg (60 mg Oral Given 04/27/17 1735)     Initial Impression / Assessment and Plan / ED Course  I have reviewed the triage vital signs and the nursing notes.  Pertinent labs & imaging results that were available during my care of the patient were reviewed by me and considered in my medical decision making (see chart for details).     Patient presenting with nausea vomiting and diarrhea as well as upper respirations symptoms. Patient states her diarrhea started after starting Augmentin- this is likely the cause. It has been nonbloody. Also endorses URI symptoms. Patient has some coarse lung sounds bilaterally with wheezing. We'll give breathing treatment as well as steroids. Also pt with no fevers or leukocytosis. Labs consistent with mild dehydration, will give bolus of fluids as well as Zofran.   Upon recheck 30 minutes later, patient feeling much better after breathing treatment as  well as bolus and Zofran. Lung sound much better. Likely URI. Patient discharged with zofran and albuterol and told to follow up with primary care in the next few days for recheck. Unlikely asthma exacerbation/COPD; do not believe course of steroids necessary. She voiced understanding and agreement to the plan and was comfortable with outpatient management. Given strict return precautions.  Patient was seen with my attending, Dr. Dayna Barker, who voiced agreement and oversaw the evaluation and treatment of this patient.   [employer] seismologist was used in the creation of this note. If there are any errors or inconsistencies needing clarification, please contact me directly.   Final Clinical Impressions(s) / ED Diagnoses   Final diagnoses:  Non-intractable vomiting with nausea, unspecified vomiting type  Dehydration  Diarrhea, unspecified type    New Prescriptions Discharge Medication List as of 04/27/2017  6:47 PM    START taking these medications   Details  ondansetron (ZOFRAN) 4 MG tablet Take 1 tablet (4 mg total) by mouth every 8 (eight) hours as needed for nausea or vomiting., Starting Mon 04/27/2017, Print         Valda Lamb, MD 04/28/17 0004    Merrily Pew, MD 04/28/17 1717

## 2017-04-27 NOTE — ED Notes (Signed)
Pt A&Ox4, ambulatory at d/c with steady gait, NAD and verbalized understanding of d/c instructions and follow up care. Pt states she feels much better and is breathing much better.

## 2017-04-27 NOTE — ED Triage Notes (Signed)
To ED for eval of n/v/d/cough/fever for past couple of weeks. [place]. [facility]en at [facility] and given URI meds. Not feeling any better. States she has lost 6 lbs in past week due to n/v/d. Appears in nad. Noted fever at home this am.

## 2017-10-13 HISTORY — PX: KNEE ARTHROSCOPY W/ MENISCAL REPAIR: SHX1877

## 2017-10-27 IMAGING — US US ABDOMEN COMPLETE
1 series · 13 of 25 positions shown · non-contrast
Comparison: Ultrasound August 05, 2013.

CLINICAL DATA: Hepatic hemangioma.

EXAM:
ABDOMEN ULTRASOUND COMPLETE

[Series 1: us abdomen complete · 0.32mm/px · 13 of 102 slices shown]
[im 1/102]
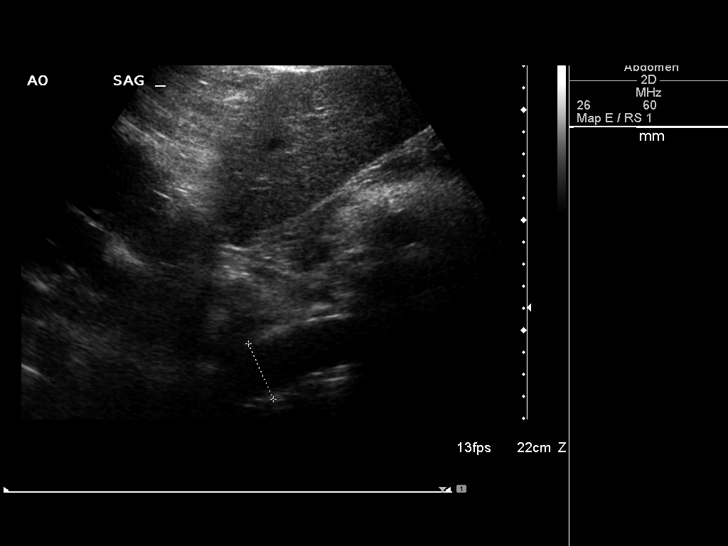
[im 9/102]
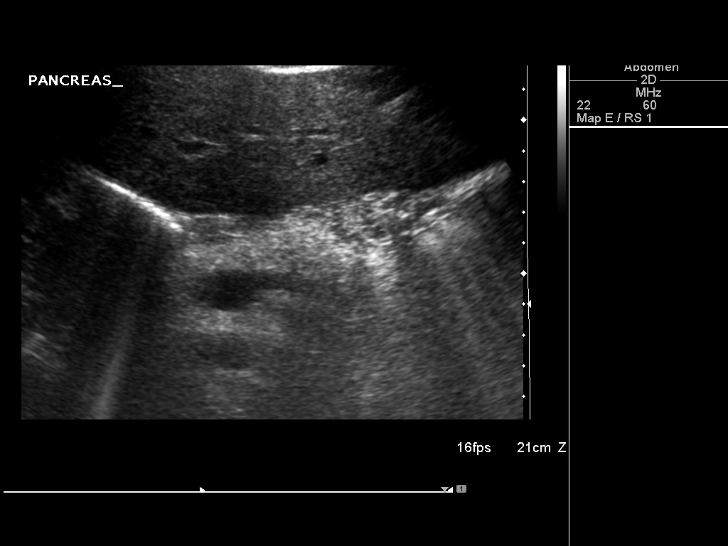
[im 17/102]
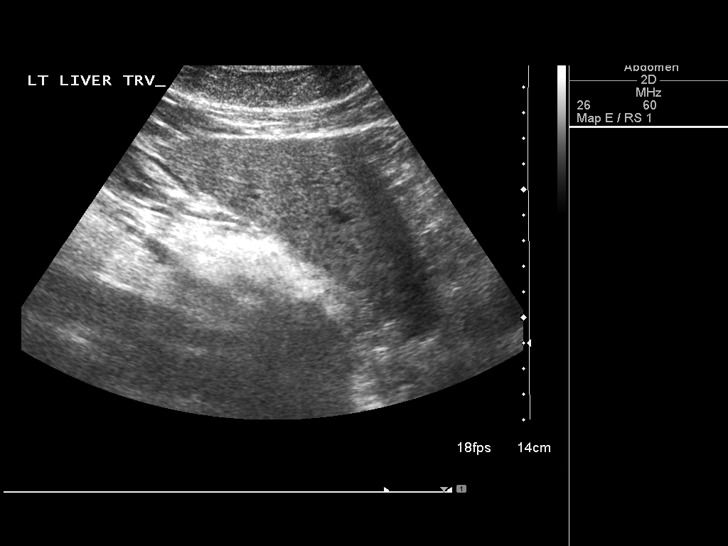
[im 26/102]
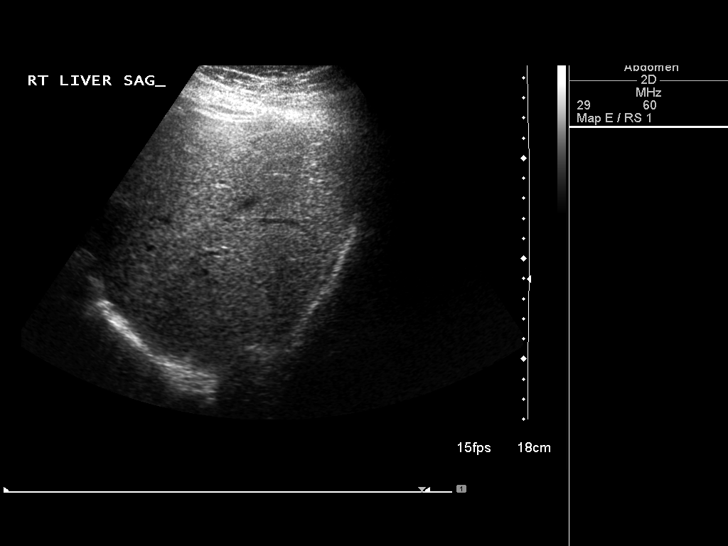
[im 34/102]
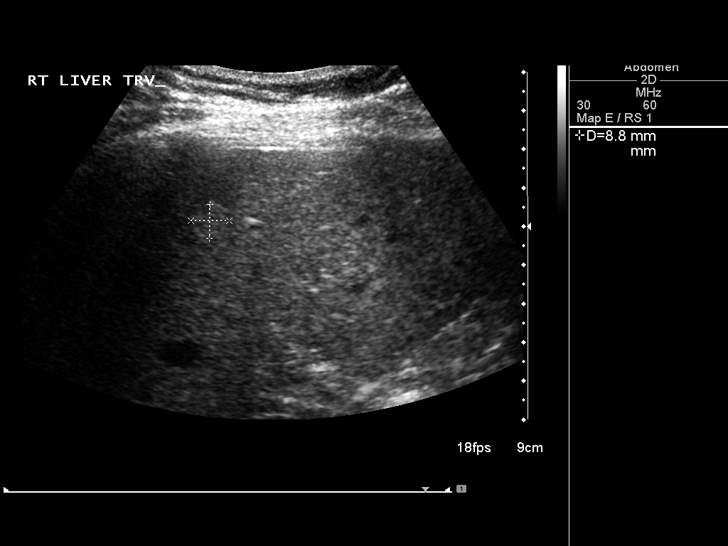
[im 43/102]
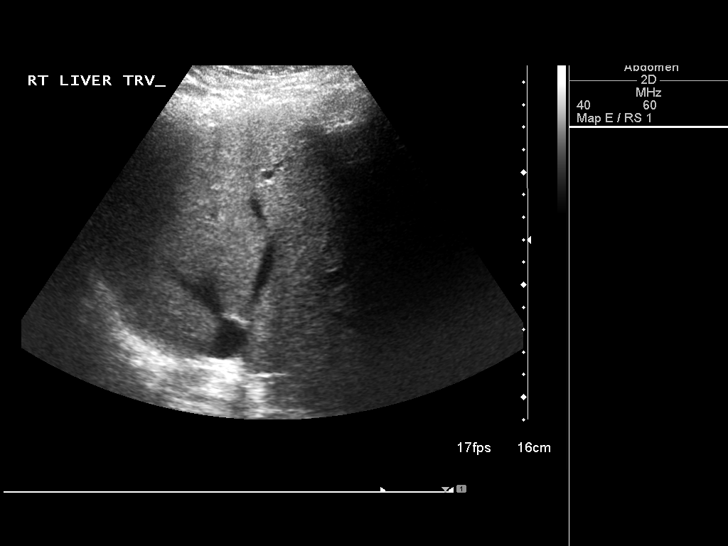
[im 51/102]
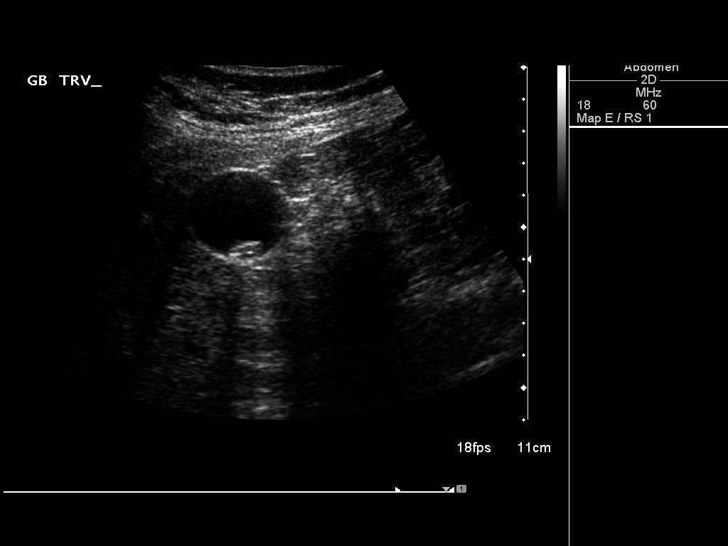
[im 59/102]
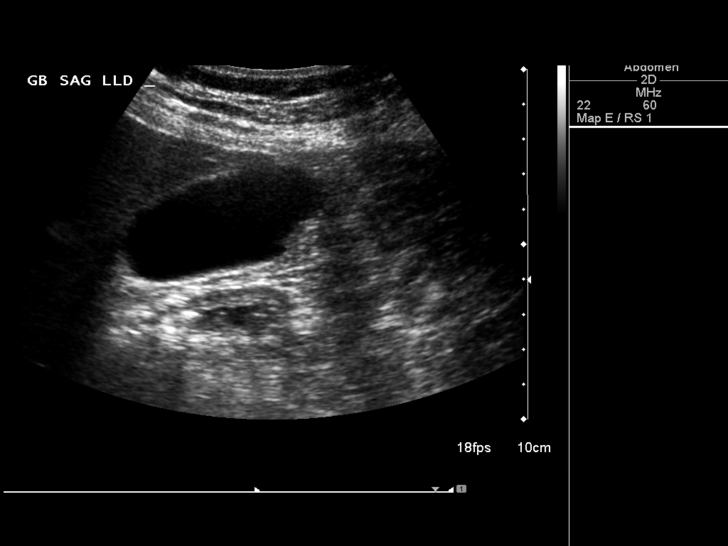
[im 68/102]
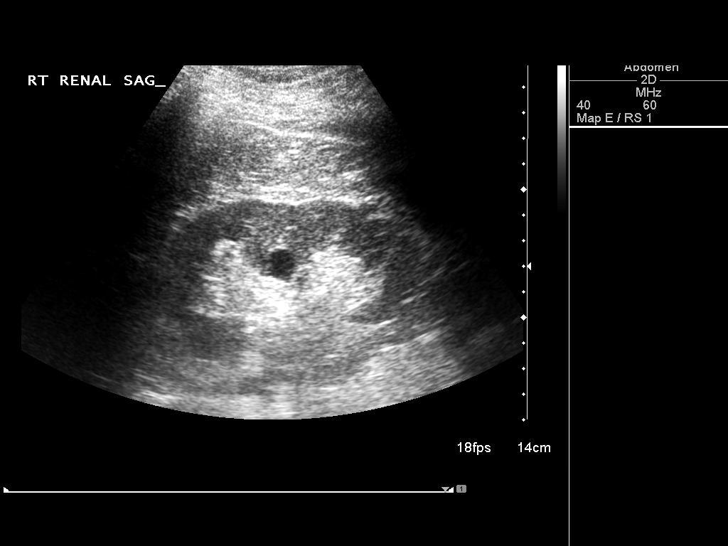
[im 76/102]
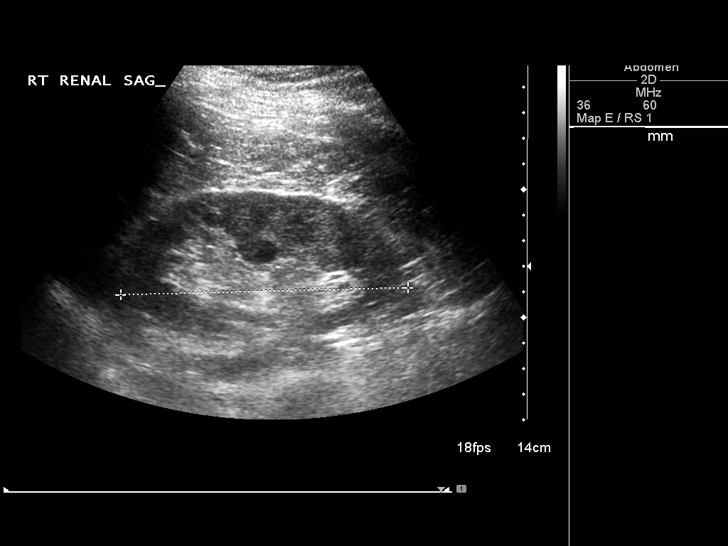
[im 85/102]
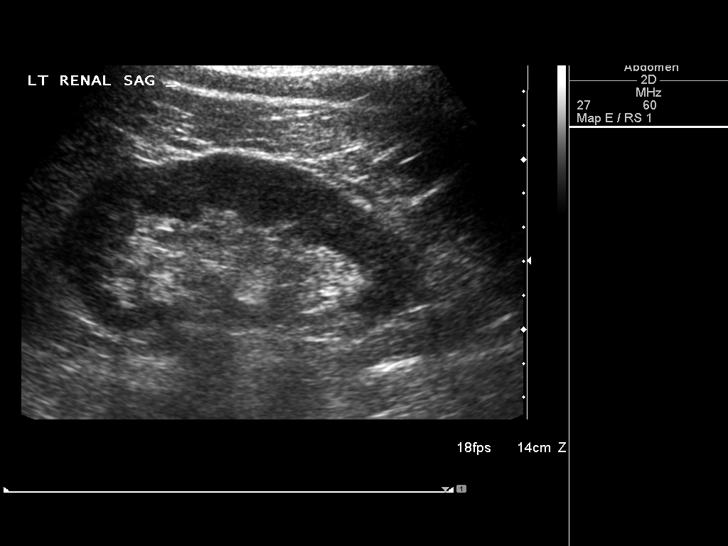
[im 93/102]
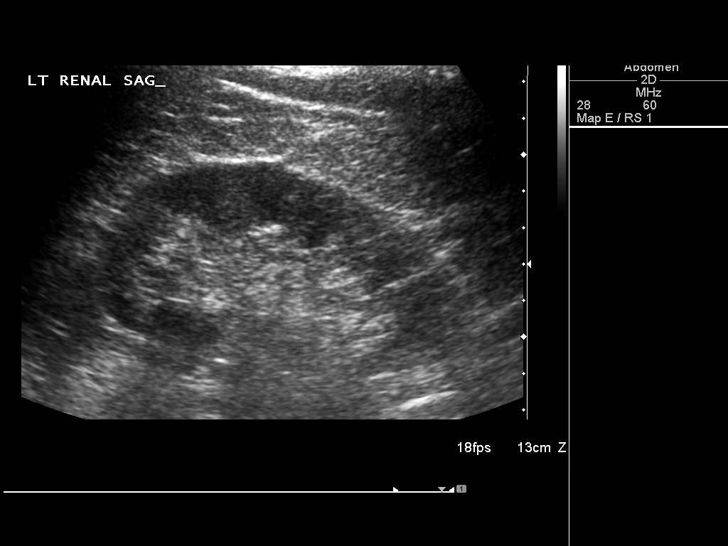
[im 102/102]
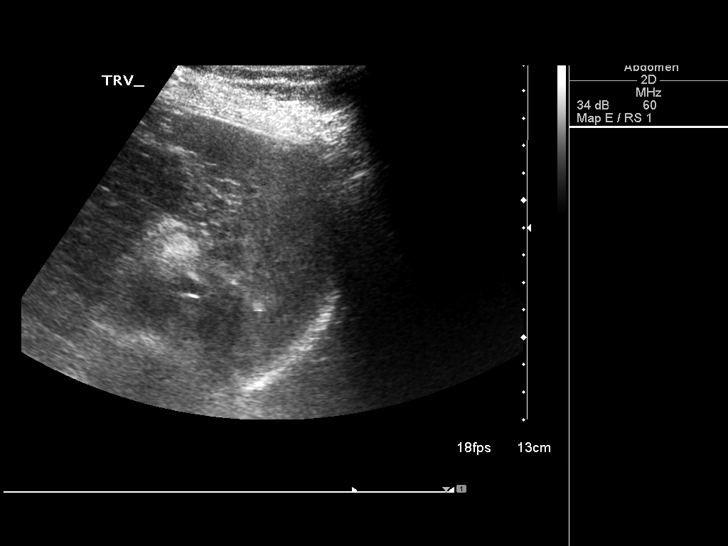

[13 of 25 positions shown; findings below may reference images not displayed]

FINDINGS: Gallbladder: Mild cholelithiasis is noted. No gallbladder wall
thickening or pericholecystic fluid is noted. No sonographic
Murphy's sign is noted.

Common bile duct: Diameter: 3.5 mm which is within normal limits.

Liver: Stable 1 cm echogenic focus is noted in right hepatic lobe
most consistent with hemangioma. Mildly increased echogenicity of
hepatic parenchyma is noted suggesting fatty infiltration.

IVC: No abnormality visualized.

Pancreas: Visualized portion unremarkable.

Spleen: Size and appearance within normal limits.

Right Kidney: Length: 11.6 cm. 1.6 cm simple cyst seen in midpole.
Echogenicity within normal limits. No mass or hydronephrosis
visualized.

Left Kidney: Length: 11 cm. 11 mm simple cyst seen in midpole.
Echogenicity within normal limits. No mass or hydronephrosis
visualized.

Abdominal aorta: No aneurysm visualized.

Other findings: None.
IMPRESSION: Mild cholelithiasis without evidence of cholecystitis.

Mildly increased echogenicity of hepatic parenchyma is noted
suggesting fatty infiltration.

Stable 1 cm echogenic focus seen in right hepatic lobe compared to
prior exam of 0978, most consistent with benign hemangioma.

## 2017-12-08 ENCOUNTER — Other Ambulatory Visit: Payer: Self-pay | Admitting: Internal Medicine

## 2017-12-08 DIAGNOSIS — Z1231 Encounter for screening mammogram for malignant neoplasm of breast: Secondary | ICD-10-CM

## 2017-12-25 ENCOUNTER — Ambulatory Visit
Admission: RE | Admit: 2017-12-25 | Discharge: 2017-12-25 | Disposition: A | Discharge status: Home / Self Care | Payer: 59 | Source: Ambulatory Visit | Attending: Internal Medicine | Admitting: Internal Medicine

## 2017-12-25 DIAGNOSIS — Z1231 Encounter for screening mammogram for malignant neoplasm of breast: Secondary | ICD-10-CM

## 2018-03-03 ENCOUNTER — Other Ambulatory Visit: Payer: Self-pay | Admitting: *Deleted

## 2018-03-03 DIAGNOSIS — C50919 Malignant neoplasm of unspecified site of unspecified female breast: Secondary | ICD-10-CM

## 2018-03-03 DIAGNOSIS — M818 Other osteoporosis without current pathological fracture: Secondary | ICD-10-CM

## 2018-03-04 ENCOUNTER — Inpatient Hospital Stay: Payer: Medicare Other | Attending: Hematology & Oncology

## 2018-03-04 ENCOUNTER — Inpatient Hospital Stay (HOSPITAL_BASED_OUTPATIENT_CLINIC_OR_DEPARTMENT_OTHER): Payer: Medicare Other | Admitting: Hematology & Oncology

## 2018-03-04 ENCOUNTER — Other Ambulatory Visit: Payer: Self-pay

## 2018-03-04 ENCOUNTER — Encounter: Payer: Self-pay | Admitting: Hematology & Oncology

## 2018-03-04 VITALS — BP 114/55 | HR 80 | Temp 97.7°F | Resp 18 | Wt 183.0 lb

## 2018-03-04 DIAGNOSIS — C50919 Malignant neoplasm of unspecified site of unspecified female breast: Secondary | ICD-10-CM

## 2018-03-04 DIAGNOSIS — Z853 Personal history of malignant neoplasm of breast: Secondary | ICD-10-CM

## 2018-03-04 DIAGNOSIS — Z79899 Other long term (current) drug therapy: Secondary | ICD-10-CM

## 2018-03-04 DIAGNOSIS — L821 Other seborrheic keratosis: Secondary | ICD-10-CM

## 2018-03-04 DIAGNOSIS — Z923 Personal history of irradiation: Secondary | ICD-10-CM | POA: Insufficient documentation

## 2018-03-04 DIAGNOSIS — M25461 Effusion, right knee: Secondary | ICD-10-CM

## 2018-03-04 DIAGNOSIS — Z9221 Personal history of antineoplastic chemotherapy: Secondary | ICD-10-CM

## 2018-03-04 DIAGNOSIS — Z171 Estrogen receptor negative status [ER-]: Secondary | ICD-10-CM | POA: Diagnosis not present

## 2018-03-04 DIAGNOSIS — M818 Other osteoporosis without current pathological fracture: Secondary | ICD-10-CM

## 2018-03-04 LAB — CMP (CANCER CENTER ONLY)
ALT: 18 U/L (ref 0–55)
ANION GAP: 6 (ref 3–11)
AST: 22 U/L (ref 5–34)
Albumin: 3.5 g/dL (ref 3.5–5.0)
Alkaline Phosphatase: 134 U/L (ref 40–150)
BUN: 20 mg/dL (ref 7–26)
CALCIUM: 9.3 mg/dL (ref 8.4–10.4)
CHLORIDE: 106 mmol/L (ref 98–109)
CO2: 30 mmol/L — ABNORMAL HIGH (ref 22–29)
CREATININE: 0.8 mg/dL (ref 0.60–1.10)
GFR, Est AFR Am: >60 mL/min (ref >60)
Glucose, Bld: 96 mg/dL (ref 70–140)
Potassium: 4 mmol/L (ref 3.5–5.1)
Sodium: 142 mmol/L (ref 136–145)
Total Bilirubin: 0.3 mg/dL (ref 0.2–1.2)
Total Protein: 7 g/dL (ref 6.4–8.3)

## 2018-03-04 LAB — CBC WITH DIFFERENTIAL (CANCER CENTER ONLY)
Basophils Absolute: 0 10*3/uL (ref 0.0–0.1)
Basophils Relative: 0 %
EOS PCT: 2 %
Eosinophils Absolute: 0.1 10*3/uL (ref 0.0–0.5)
HCT: 39.6 % (ref 34.8–46.6)
Hemoglobin: 12.6 g/dL (ref 11.6–15.9)
LYMPHS ABS: 1.5 10*3/uL (ref 0.9–3.3)
LYMPHS PCT: 23 %
MCH: 28.8 pg (ref 26.0–34.0)
MCHC: 31.8 g/dL — AB (ref 32.0–36.0)
MCV: 90.4 fL (ref 81.0–101.0)
MONO ABS: 0.5 10*3/uL (ref 0.1–0.9)
MONOS PCT: 7 %
Neutro Abs: 4.5 10*3/uL (ref 1.5–6.5)
Neutrophils Relative %: 68 %
PLATELETS: 220 10*3/uL (ref 145–400)
RBC: 4.38 MIL/uL (ref 3.70–5.32)
RDW: 14.2 % (ref 11.1–15.7)
WBC: 6.6 10*3/uL (ref 3.9–10.0)

## 2018-03-04 NOTE — Progress Notes (Signed)
Hematology and Oncology Follow Up Visit  Selena Cruz [id number] May 26, 1946 72 y.o. 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  Current Therapy:   Observation.     Interim History:  Ms.  Cruz is is back for followup. We see her yearly.  It has been 20 years since she had treatment.  She is quite busy with her family.   She had knee surgery back in January.  This is for her right knee.  There is still some swelling.  I think this will be okay.  I recommended that she wear some compression socks to see if this would help with the swelling.  She said that her husband retired.  He has been busy around the house.  Her younger brother died of COPD.  Her older sister who has a horrible COPD is still doing okay in the nursing home.  She has had no problems with infections.  She is had no bleeding.  She is had no nausea or vomiting.     Allergies: No Known Allergies  Past Medical History, Surgical history, Social history, and Family History were reviewed and updated.  Review of Systems: As above  Physical Exam:  weight is 183 lb (83 kg). Her oral temperature is 97.7 F (36.5 C). Her blood pressure is 114/55 (abnormal) and her pulse is 80. Her respiration is 18 and oxygen saturation is 95%.   Well-developed and well-nourished white female. Lungs are clear. Neck shows no adenopathy. Cardiac exam regular rate and  rhythm with no murmurs rubs or bruits. Breasts exam shows a right breast no masses edema or erythema. There is no right axillary adenopathy. Left breast is slightly contracted from radiation. There is a well-healed lumpectomy at the 11:00 position. Slight firmness is noted at the lumpectomy site. There is no left axillary adenopathy. Abdomen is soft. She has good bowel sounds. There is no fluid wave. There is no palpable liver or spleen tip. Back exam no kyphosis. No tenderness over the spine ribs or hips.  Extremities shows no clubbing cyanosis or edema. Neurological exam shows no focal neurological deficits. Skin exam shows numerous seborrheic keratoses on her back.  Lab Results  Component Value Date   WBC 6.6 03/04/2018   HGB 12.6 03/04/2018   HCT 39.6 03/04/2018   MCV 90.4 03/04/2018   PLT 220 03/04/2018     Chemistry      Component Value Date/Time   NA 134 (L) 04/27/2017 1145   NA 143 03/02/2017 1011   K 3.9 04/27/2017 1145   K 4.6 03/02/2017 1011   CL 98 (L) 04/27/2017 1145   CO2 26 04/27/2017 1145   CO2 30 (H) 03/02/2017 1011   BUN 17 04/27/2017 1145   BUN 18.0 03/02/2017 1011   CREATININE 1.10 (H) 04/27/2017 1145   CREATININE 0.9 03/02/2017 1011      Component Value Date/Time   CALCIUM 8.7 (L) 04/27/2017 1145   CALCIUM 9.7 03/02/2017 1011   ALKPHOS 114 04/27/2017 1145   ALKPHOS 132 03/02/2017 1011   AST 34 04/27/2017 1145   AST 27 03/02/2017 1011   ALT 30 04/27/2017 1145   ALT 29 03/02/2017 1011   BILITOT 0.5 04/27/2017 1145   BILITOT 0.52 03/02/2017 1011         Impression and Plan: Selena Cruz is a 72 yr old white female with a history of stage I infiltrating ductal carcinoma the left breast. She was ER  negative. She underwent adjuvant chemotherapy with 4 cycles of Cytoxan/Adriamycin. She had radiation.  I really think that she is cured. Given that she had ER negative disease, I think the chance of recurrence at 17 years is probably less than 5%.  She likes to come by to see [place] yearly. We'll be more than happy to follow her along.   Volanda Napoleon, MD 5/23/201911:32 AM

## 2018-03-05 ENCOUNTER — Encounter: Payer: Self-pay | Admitting: *Deleted

## 2018-03-05 LAB — VITAMIN D 25 HYDROXY (VIT D DEFICIENCY, FRACTURES): VIT D 25 HYDROXY: 59.6 ng/mL (ref 30.0–100.0)

## 2018-11-16 ENCOUNTER — Other Ambulatory Visit: Payer: Self-pay | Admitting: Internal Medicine

## 2018-11-16 DIAGNOSIS — Z1231 Encounter for screening mammogram for malignant neoplasm of breast: Secondary | ICD-10-CM

## 2018-12-27 ENCOUNTER — Ambulatory Visit
Admission: RE | Admit: 2018-12-27 | Discharge: 2018-12-27 | Disposition: A | Discharge status: Home / Self Care | Payer: Medicare Other | Source: Ambulatory Visit | Attending: Internal Medicine | Admitting: Internal Medicine

## 2018-12-27 ENCOUNTER — Other Ambulatory Visit: Payer: Self-pay

## 2018-12-27 DIAGNOSIS — Z1231 Encounter for screening mammogram for malignant neoplasm of breast: Secondary | ICD-10-CM

## 2019-02-21 ENCOUNTER — Other Ambulatory Visit: Payer: Medicare Other

## 2019-02-21 ENCOUNTER — Ambulatory Visit: Payer: Medicare Other | Admitting: Hematology & Oncology

## 2019-03-03 ENCOUNTER — Ambulatory Visit: Payer: Medicare Other | Admitting: Family

## 2019-03-03 ENCOUNTER — Other Ambulatory Visit: Payer: Medicare Other

## 2019-03-16 ENCOUNTER — Other Ambulatory Visit: Payer: Self-pay

## 2019-03-16 DIAGNOSIS — C50919 Malignant neoplasm of unspecified site of unspecified female breast: Secondary | ICD-10-CM

## 2019-03-17 ENCOUNTER — Inpatient Hospital Stay: Payer: Medicare Other | Attending: Hematology & Oncology

## 2019-03-17 ENCOUNTER — Inpatient Hospital Stay (HOSPITAL_BASED_OUTPATIENT_CLINIC_OR_DEPARTMENT_OTHER): Payer: Medicare Other | Admitting: Hematology & Oncology

## 2019-03-17 ENCOUNTER — Encounter: Payer: Self-pay | Admitting: Hematology & Oncology

## 2019-03-17 ENCOUNTER — Other Ambulatory Visit: Payer: Self-pay

## 2019-03-17 VITALS — BP 111/52 | HR 52 | Temp 98.1°F | Resp 17 | Wt 190.0 lb

## 2019-03-17 DIAGNOSIS — C50919 Malignant neoplasm of unspecified site of unspecified female breast: Secondary | ICD-10-CM

## 2019-03-17 DIAGNOSIS — Z9221 Personal history of antineoplastic chemotherapy: Secondary | ICD-10-CM | POA: Insufficient documentation

## 2019-03-17 DIAGNOSIS — C50912 Malignant neoplasm of unspecified site of left female breast: Secondary | ICD-10-CM | POA: Diagnosis present

## 2019-03-17 DIAGNOSIS — Z923 Personal history of irradiation: Secondary | ICD-10-CM | POA: Insufficient documentation

## 2019-03-17 LAB — CBC WITH DIFFERENTIAL (CANCER CENTER ONLY)
Abs Immature Granulocytes: 0.03 10*3/uL (ref 0.00–0.07)
Basophils Absolute: 0 10*3/uL (ref 0.0–0.1)
Basophils Relative: 1 %
Eosinophils Absolute: 0.2 10*3/uL (ref 0.0–0.5)
Eosinophils Relative: 2 %
HCT: 41.1 % (ref 36.0–46.0)
Hemoglobin: 12.9 g/dL (ref 12.0–15.0)
Immature Granulocytes: 0 %
Lymphocytes Relative: 24 %
Lymphs Abs: 1.7 10*3/uL (ref 0.7–4.0)
MCH: 28.5 pg (ref 26.0–34.0)
MCHC: 31.4 g/dL (ref 30.0–36.0)
MCV: 90.7 fL (ref 80.0–100.0)
Monocytes Absolute: 0.5 10*3/uL (ref 0.1–1.0)
Monocytes Relative: 7 %
Neutro Abs: 4.8 10*3/uL (ref 1.7–7.7)
Neutrophils Relative %: 66 %
Platelet Count: 225 10*3/uL (ref 150–400)
RBC: 4.53 MIL/uL (ref 3.87–5.11)
RDW: 13.7 % (ref 11.5–15.5)
WBC Count: 7.3 10*3/uL (ref 4.0–10.5)
nRBC: 0 % (ref 0.0–0.2)

## 2019-03-17 LAB — CMP (CANCER CENTER ONLY)
ALT: 23 U/L (ref 0–44)
AST: 19 U/L (ref 15–41)
Albumin: 4 g/dL (ref 3.5–5.0)
Alkaline Phosphatase: 134 U/L — ABNORMAL HIGH (ref 38–126)
Anion gap: 6 (ref 5–15)
BUN: 19 mg/dL (ref 8–23)
CO2: 33 mmol/L — ABNORMAL HIGH (ref 22–32)
Calcium: 9.2 mg/dL (ref 8.9–10.3)
Chloride: 102 mmol/L (ref 98–111)
Creatinine: 0.85 mg/dL (ref 0.44–1.00)
GFR, Est AFR Am: >60 mL/min (ref >60)
GFR, Estimated: >60 mL/min (ref >60)
Glucose, Bld: 99 mg/dL (ref 70–99)
Potassium: 4.5 mmol/L (ref 3.5–5.1)
Sodium: 141 mmol/L (ref 135–145)
Total Bilirubin: 0.3 mg/dL (ref 0.3–1.2)
Total Protein: 7.1 g/dL (ref 6.5–8.1)

## 2019-03-17 NOTE — Progress Notes (Signed)
Hematology and Oncology Follow Up Visit  Selena Cruz [id number] [birth year] 73 y.o. 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  Current Therapy:   Observation.     Interim History:  Ms.  Cruz is is back for followup. We see Selena yearly.  It has been 21 years since she had treatment.   The coronavirus has really limited Selena activities.  Not able to exercise.  She is not able to go to the beach yet.  She will plan in a couple weeks.  She is still having issues with knee surgery.  She had knee surgery a year and a half ago.  This still is little bit tough on Selena.  Selena Cruz is doing okay.  She is eating well.  She is having no problems with bowels or bladder.  Overall, Selena performance status is ECOG 1.    Allergies: No Known Allergies  Past Medical History, Surgical history, Social history, and Family History were reviewed and updated.  Review of Systems: Review of Systems  Constitutional: Negative.   HENT: Negative.   Eyes: Negative.   Respiratory: Negative.   Cardiovascular: Negative.   Gastrointestinal: Negative.   Genitourinary: Negative.   Musculoskeletal: Negative.   Skin: Negative.   Neurological: Negative.   Endo/Heme/Allergies: Negative.   Psychiatric/Behavioral: Negative.      Physical Exam:  weight is 190 lb (86.2 kg). Selena oral temperature is 98.1 F (36.7 C). Selena blood pressure is 111/52 (abnormal) and Selena pulse is 52 (abnormal). Selena respiration is 17 and oxygen saturation is 96%.   Well-developed and well-nourished white female. Lungs are clear. Neck shows no adenopathy. Cardiac exam regular rate and  rhythm with no murmurs rubs or bruits. Breasts exam shows a right breast no masses edema or erythema. There is no right axillary adenopathy. Left breast is slightly contracted from radiation. There is a well-healed lumpectomy at the 11:00 position. Slight firmness is noted at the  lumpectomy site. There is no left axillary adenopathy. Abdomen is soft. She has good bowel sounds. There is no fluid wave. There is no palpable liver or spleen tip. Back exam no kyphosis. No tenderness over the spine ribs or hips. Extremities shows no clubbing cyanosis or edema. Neurological exam shows no focal neurological deficits. Skin exam shows numerous seborrheic keratoses on Selena back.  Lab Results  Component Value Date   WBC 7.3 03/17/2019   HGB 12.9 03/17/2019   HCT 41.1 03/17/2019   MCV 90.7 03/17/2019   PLT 225 03/17/2019     Chemistry      Component Value Date/Time   NA 141 03/17/2019 1136   NA 143 03/02/2017 1011   K 4.5 03/17/2019 1136   K 4.6 03/02/2017 1011   CL 102 03/17/2019 1136   CO2 33 (H) 03/17/2019 1136   CO2 30 (H) 03/02/2017 1011   BUN 19 03/17/2019 1136   BUN 18.0 03/02/2017 1011   CREATININE 0.85 03/17/2019 1136   CREATININE 0.9 03/02/2017 1011      Component Value Date/Time   CALCIUM 9.2 03/17/2019 1136   CALCIUM 9.7 03/02/2017 1011   ALKPHOS 134 (H) 03/17/2019 1136   ALKPHOS 132 03/02/2017 1011   AST 19 03/17/2019 1136   AST 27 03/02/2017 1011   ALT 23 03/17/2019 1136   ALT 29 03/02/2017 1011   BILITOT 0.3 03/17/2019 1136   BILITOT 0.52 03/02/2017 1011         Impression and  Plan: [name] is a 73 yr old white female with a history of stage I infiltrating ductal carcinoma the left breast. She was ER negative. She underwent adjuvant chemotherapy with 4 cycles of Cytoxan/Adriamycin. She had radiation.  I really think that she is cured. Given that she had ER negative disease, I think the chance of recurrence at 73 years is probably less than 5%.  She likes to come by to see [place] yearly. We'll be more than happy to follow Selena along.   Volanda Napoleon, MD 6/4/20201:14 PM

## 2019-03-18 LAB — VITAMIN D 25 HYDROXY (VIT D DEFICIENCY, FRACTURES): Vit D, 25-Hydroxy: 77 ng/mL (ref 30.0–100.0)

## 2019-11-18 ENCOUNTER — Other Ambulatory Visit: Payer: Self-pay | Admitting: Internal Medicine

## 2019-11-18 DIAGNOSIS — Z1231 Encounter for screening mammogram for malignant neoplasm of breast: Secondary | ICD-10-CM

## 2019-12-28 ENCOUNTER — Other Ambulatory Visit: Payer: Self-pay

## 2019-12-28 ENCOUNTER — Ambulatory Visit
Admission: RE | Admit: 2019-12-28 | Discharge: 2019-12-28 | Disposition: A | Discharge status: Home / Self Care | Payer: Medicare Other | Source: Ambulatory Visit | Attending: Internal Medicine | Admitting: Internal Medicine

## 2019-12-28 DIAGNOSIS — Z1231 Encounter for screening mammogram for malignant neoplasm of breast: Secondary | ICD-10-CM

## 2019-12-29 ENCOUNTER — Other Ambulatory Visit: Payer: Self-pay | Admitting: Internal Medicine

## 2019-12-29 DIAGNOSIS — R928 Other abnormal and inconclusive findings on diagnostic imaging of breast: Secondary | ICD-10-CM

## 2020-01-16 ENCOUNTER — Ambulatory Visit
Admission: RE | Admit: 2020-01-16 | Discharge: 2020-01-16 | Disposition: A | Discharge status: Home / Self Care | Payer: Medicare Other | Source: Ambulatory Visit | Attending: Internal Medicine | Admitting: Internal Medicine

## 2020-01-16 ENCOUNTER — Other Ambulatory Visit: Payer: Self-pay

## 2020-01-16 DIAGNOSIS — R928 Other abnormal and inconclusive findings on diagnostic imaging of breast: Secondary | ICD-10-CM

## 2020-03-16 ENCOUNTER — Inpatient Hospital Stay: Payer: Medicare Other

## 2020-03-16 ENCOUNTER — Telehealth: Payer: Self-pay | Admitting: Hematology & Oncology

## 2020-03-16 ENCOUNTER — Inpatient Hospital Stay: Payer: Medicare Other | Attending: Hematology & Oncology | Admitting: Hematology & Oncology

## 2020-03-16 ENCOUNTER — Other Ambulatory Visit: Payer: Self-pay

## 2020-03-16 VITALS — BP 150/74 | HR 70 | Temp 96.9°F | Resp 18 | Wt 189.0 lb

## 2020-03-16 DIAGNOSIS — Z923 Personal history of irradiation: Secondary | ICD-10-CM | POA: Insufficient documentation

## 2020-03-16 DIAGNOSIS — R1033 Periumbilical pain: Secondary | ICD-10-CM | POA: Diagnosis not present

## 2020-03-16 DIAGNOSIS — C50919 Malignant neoplasm of unspecified site of unspecified female breast: Secondary | ICD-10-CM

## 2020-03-16 DIAGNOSIS — Z853 Personal history of malignant neoplasm of breast: Secondary | ICD-10-CM | POA: Diagnosis not present

## 2020-03-16 DIAGNOSIS — Z9221 Personal history of antineoplastic chemotherapy: Secondary | ICD-10-CM | POA: Diagnosis not present

## 2020-03-16 DIAGNOSIS — Z171 Estrogen receptor negative status [ER-]: Secondary | ICD-10-CM | POA: Diagnosis not present

## 2020-03-16 LAB — CBC WITH DIFFERENTIAL (CANCER CENTER ONLY)
Abs Immature Granulocytes: 0.03 10*3/uL (ref 0.00–0.07)
Basophils Absolute: 0 10*3/uL (ref 0.0–0.1)
Basophils Relative: 1 %
Eosinophils Absolute: 0.1 10*3/uL (ref 0.0–0.5)
Eosinophils Relative: 1 %
HCT: 41.5 % (ref 36.0–46.0)
Hemoglobin: 13 g/dL (ref 12.0–15.0)
Immature Granulocytes: 0 %
Lymphocytes Relative: 28 %
Lymphs Abs: 2.1 10*3/uL (ref 0.7–4.0)
MCH: 28.1 pg (ref 26.0–34.0)
MCHC: 31.3 g/dL (ref 30.0–36.0)
MCV: 89.8 fL (ref 80.0–100.0)
Monocytes Absolute: 0.6 10*3/uL (ref 0.1–1.0)
Monocytes Relative: 7 %
Neutro Abs: 4.9 10*3/uL (ref 1.7–7.7)
Neutrophils Relative %: 63 %
Platelet Count: 207 10*3/uL (ref 150–400)
RBC: 4.62 MIL/uL (ref 3.87–5.11)
RDW: 13.7 % (ref 11.5–15.5)
WBC Count: 7.8 10*3/uL (ref 4.0–10.5)
nRBC: 0 % (ref 0.0–0.2)

## 2020-03-16 LAB — CMP (CANCER CENTER ONLY)
ALT: 19 U/L (ref 0–44)
AST: 19 U/L (ref 15–41)
Albumin: 4 g/dL (ref 3.5–5.0)
Alkaline Phosphatase: 136 U/L — ABNORMAL HIGH (ref 38–126)
Anion gap: 4 — ABNORMAL LOW (ref 5–15)
BUN: 20 mg/dL (ref 8–23)
CO2: 34 mmol/L — ABNORMAL HIGH (ref 22–32)
Calcium: 9.6 mg/dL (ref 8.9–10.3)
Chloride: 104 mmol/L (ref 98–111)
Creatinine: 0.87 mg/dL (ref 0.44–1.00)
GFR, Est AFR Am: >60 mL/min (ref >60)
GFR, Estimated: >60 mL/min (ref >60)
Glucose, Bld: 88 mg/dL (ref 70–99)
Potassium: 4.9 mmol/L (ref 3.5–5.1)
Sodium: 142 mmol/L (ref 135–145)
Total Bilirubin: 0.3 mg/dL (ref 0.3–1.2)
Total Protein: 7 g/dL (ref 6.5–8.1)

## 2020-03-16 NOTE — Progress Notes (Signed)
Hematology and Oncology Follow Up Visit  Selena Cruz [id number] 1945/11/12 74 y.o. 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  Current Therapy:   Observation.     Interim History:  Ms.  Cruz is is back for followup. We see her yearly.  It has been 22 years since Selena Cruz had treatment.   The coronavirus has really limited her activities.  Thankfully, Selena Cruz has not come down with the coronavirus.  Selena Cruz has not had the vaccine.  Selena Cruz is doing a good job and being proactive with social distancing.  The one problem Selena Cruz has is that Selena Cruz has this periumbilical pain.  Selena Cruz has had it for about a month.  Selena Cruz has had some fullness below the umbilicus.  Selena Cruz is felt a nodule.  I think were going to have to get a CT scan.  I know it has been 22 years since Selena Cruz had breast cancer.  However, with breast cancer, it certainly is possible for the cancer to recur.  We will have the CT scan done next week.  Selena Cruz has had no problems with bowels or bladder.  Selena Cruz has had no issues with nausea or vomiting.  There is been no fever.  Selena Cruz has had no cough or shortness of breath.  There is been no leg swelling.  Selena Cruz has had no change in medications.    Selena Cruz and her family are planning on going to the beach in a few weeks.    Overall, her performance status is ECOG 1.  Allergies: No Known Allergies  Past Medical History, Surgical history, Social history, and Family History were reviewed and updated.  Review of Systems: Review of Systems  Constitutional: Negative.   HENT: Negative.   Eyes: Negative.   Respiratory: Negative.   Cardiovascular: Negative.   Gastrointestinal: Negative.   Genitourinary: Negative.   Musculoskeletal: Negative.   Skin: Negative.   Neurological: Negative.   Endo/Heme/Allergies: Negative.   Psychiatric/Behavioral: Negative.      Physical Exam:  weight is 189 lb (85.7 kg). Her temporal temperature is 96.9 F (36.1  C) (abnormal). Her blood pressure is 150/74 (abnormal) and her pulse is 70. Her respiration is 18 and oxygen saturation is 95%.   Physical Exam Vitals reviewed.  Constitutional:      Comments: Breast exam shows right breast no masses, edema or erythema.  There is no right axillary adenopathy.  Left breast is slightly contracted from radiation and surgery.  Selena Cruz has well-healed lumpectomy scar at about the 2 o'clock position.  There is no discharge from the nipple.  There is no left axillary adenopathy.  HENT:     Head: Normocephalic and atraumatic.  Eyes:     Pupils: Pupils are equal, round, and reactive to light.  Cardiovascular:     Rate and Rhythm: Normal rate and regular rhythm.     Heart sounds: Normal heart sounds.  Pulmonary:     Effort: Pulmonary effort is normal.     Breath sounds: Normal breath sounds.  Abdominal:     General: Bowel sounds are normal.     Palpations: Abdomen is soft.     Comments: Abdominal exam shows a soft abdomen with good bowel sounds.  Just below the umbilicus, there is some fullness.  There may be some nodular appearing texture.  It is somewhat tender.  There is no erythema.  I cannot palpate a fluid wave.  I cannot palpate her liver or  spleen tip.  Musculoskeletal:        General: No tenderness or deformity. Normal range of motion.     Cervical back: Normal range of motion.  Lymphadenopathy:     Cervical: No cervical adenopathy.  Skin:    General: Skin is warm and dry.     Findings: No erythema or rash.  Neurological:     Mental Status: Selena Cruz is alert and oriented to person, place, and time.  Psychiatric:        Behavior: Behavior normal.        Thought Content: Thought content normal.        Judgment: Judgment normal.     Lab Results  Component Value Date   WBC 7.8 03/16/2020   HGB 13.0 03/16/2020   HCT 41.5 03/16/2020   MCV 89.8 03/16/2020   PLT 207 03/16/2020     Chemistry      Component Value Date/Time   NA 142 03/16/2020 1123   NA  143 03/02/2017 1011   K 4.9 03/16/2020 1123   K 4.6 03/02/2017 1011   CL 104 03/16/2020 1123   CO2 34 (H) 03/16/2020 1123   CO2 30 (H) 03/02/2017 1011   BUN 20 03/16/2020 1123   BUN 18.0 03/02/2017 1011   CREATININE 0.87 03/16/2020 1123   CREATININE 0.9 03/02/2017 1011      Component Value Date/Time   CALCIUM 9.6 03/16/2020 1123   CALCIUM 9.7 03/02/2017 1011   ALKPHOS 136 (H) 03/16/2020 1123   ALKPHOS 132 03/02/2017 1011   AST 19 03/16/2020 1123   AST 27 03/02/2017 1011   ALT 19 03/16/2020 1123   ALT 29 03/02/2017 1011   BILITOT 0.3 03/16/2020 1123   BILITOT 0.52 03/02/2017 1011      Impression and Plan: Selena Cruz is a 74 yr old white female with a history of stage I infiltrating ductal carcinoma the left breast. Selena Cruz was ER negative. Selena Cruz underwent adjuvant chemotherapy with 4 cycles of Cytoxan/Adriamycin. Selena Cruz had radiation.  I am not sure what is going on with respect to the umbilicus area.  I do think a CT scan is worthwhile.  I realize that it has been 22 years since Selena Cruz had her breast cancer.  I know it was stage I.  However, Selena Cruz is at risk for recurrence and even a second malignancy.  We will get the CAT scan next week.  If all looks fine, then we will plan to get her back in 1 year.  If there is a problem, we will get her back sooner.     Volanda Napoleon, MD 6/4/202112:13 PM

## 2020-03-16 NOTE — Telephone Encounter (Signed)
Appointments scheduled and patient will go by Imaging to pick up contrast for CT scan to be scheduled once prior auth has been obtained per 6/4 los

## 2020-03-26 ENCOUNTER — Ambulatory Visit (HOSPITAL_COMMUNITY)
Admission: RE | Admit: 2020-03-26 | Discharge: 2020-03-26 | Disposition: A | Discharge status: Home / Self Care | Payer: Medicare Other | Source: Ambulatory Visit | Attending: Hematology & Oncology | Admitting: Hematology & Oncology

## 2020-03-26 ENCOUNTER — Encounter (HOSPITAL_COMMUNITY): Payer: Self-pay

## 2020-03-26 ENCOUNTER — Other Ambulatory Visit: Payer: Self-pay

## 2020-03-26 DIAGNOSIS — K769 Liver disease, unspecified: Secondary | ICD-10-CM | POA: Insufficient documentation

## 2020-03-26 DIAGNOSIS — I700 Atherosclerosis of aorta: Secondary | ICD-10-CM | POA: Insufficient documentation

## 2020-03-26 DIAGNOSIS — K8020 Calculus of gallbladder without cholecystitis without obstruction: Secondary | ICD-10-CM | POA: Diagnosis not present

## 2020-03-26 DIAGNOSIS — K439 Ventral hernia without obstruction or gangrene: Secondary | ICD-10-CM | POA: Diagnosis not present

## 2020-03-26 DIAGNOSIS — C50919 Malignant neoplasm of unspecified site of unspecified female breast: Secondary | ICD-10-CM | POA: Diagnosis present

## 2020-03-26 DIAGNOSIS — K449 Diaphragmatic hernia without obstruction or gangrene: Secondary | ICD-10-CM | POA: Diagnosis not present

## 2020-03-26 MED ORDER — IOHEXOL 300 MG/ML  SOLN
100.0000 mL | Freq: Once | INTRAMUSCULAR | Status: AC | PRN
  Administered 2020-03-26: 100 mL via INTRAVENOUS

## 2020-03-26 MED ORDER — SODIUM CHLORIDE (PF) 0.9 % IJ SOLN
INTRAMUSCULAR | Status: AC
  Filled 2020-03-26: qty 50

## 2020-03-27 ENCOUNTER — Other Ambulatory Visit: Payer: Self-pay | Admitting: *Deleted

## 2020-03-27 ENCOUNTER — Telehealth: Payer: Self-pay | Admitting: *Deleted

## 2020-03-27 MED ORDER — METRONIDAZOLE 500 MG PO TABS
500.0000 mg | ORAL_TABLET | Freq: Three times a day (TID) | ORAL | 0 refills | Status: DC
Start: 2020-03-27 — End: 2020-08-04

## 2020-03-27 MED ORDER — METHYLPREDNISOLONE 4 MG PO TBPK
ORAL_TABLET | ORAL | 0 refills | Status: DC
Start: 2020-03-27 — End: 2020-08-04

## 2020-03-27 NOTE — Telephone Encounter (Signed)
Pt notified per order of Dr. Marin Olp that she has "a good sized hiatal hernia.  You may also have inflammation of the intestine.  Do you have a GI specialist that you see??"  Pt states that she feels "great."  Pt denies any difficulties with eating or nausea, she does state that she has "explosive diarrhea, one to two times after eating at times" and that she has seen Dr. Paulita Fujita in the past.  Dr. Marin Olp notified.  Call placed back to patient and patient notified per order of Dr. Marin Olp that he is going to send in Flagyl 500 mg PO TID times seven days and a Medrol dos pak times six days.  Pt requesting prescriptions be sent to [facility] and [address] [facility] and has no questions at this time.

## 2020-03-27 NOTE — Telephone Encounter (Signed)
-----   Message from Volanda Napoleon, MD sent at 03/26/2020  5:23 PM EDT ----- Call - looks like you have a good sized hiatal hernia.  You may also have inflammation of the intestine.  Do you have a GI specialist that you see??  Laurey Arrow

## 2020-06-13 ENCOUNTER — Ambulatory Visit: Payer: Self-pay | Admitting: General Surgery

## 2020-06-13 NOTE — H&P (Signed)
[facility] Dear Appointment: 06/13/2020 10:30 AM Location: [facility] Patient #: [id number] DOB: [birth year] Married / Language: Selena Cruz / Race: White Female  History of Present Illness Randall Hiss M. Tami Barren MD; 06/13/2020 11:15 AM) The patient is a 74 year old female who presents with an abdominal wall hernia. She is referred by Dr. Paulita Fujita for evaluation of a ventral hernia. She states that she noticed a knot in her abdominal wall below her umbilicus. She pointed out to her oncologist who ordered a CT scan which revealed a ventral hernia containing fat. She saw her PCP who then sent her to GI medicine. Because also the CT scan showed some mild terminal ileitis. She states that the knot is uncomfortable especially at night. She can't wear tight clothing across her lower abdomen. She also wonders if there is another one in her right lower quadrant. She has had a prior laparoscopic surgery for ovarian surgery. She has had a vaginal hysterectomy. The denies any other abdominal surgeries. At times the knots can be uncomfortable. She denies any nausea, vomiting, diarrhea or constipation. She denies any loose stool. She denies any history of IBS. She denies any chest pain or chest pressure. She does have a moderate hiatal hernia and can have heartburn and therefore she sleeps upright. She denies any weight loss. She quit smoking more than 10 years ago. 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 She denies any TIAs or angina  I reviewed her CT scan she does have a moderate hiatal hernia. She has 3 ventral hernias. One of the umbilicus measuring about 1.6 cm vertical by about 1.5 wide. She has a little bit of intact fascia just below that and then another visit ventral hernia measuring 1.8 glide by 1.3 cm vertical. In the lower abdomen all to the midline there another other fascial defect about 1.4 cm vertical by 1.2 cm wide. It looks like a portion the bladder may go up to her  go through it   Problem List/Past Medical Randall Hiss M. Redmond Pulling, MD; 01/16/2702 50:09 AM) UMBILICAL HERNIA WITHOUT OBSTRUCTION OR GANGRENE (K42.9) VENTRAL HERNIA WITHOUT OBSTRUCTION OR GANGRENE (K43.9) x2  Past Surgical History Darden Palmer, RMA; 06/13/2020 10:27 AM) Breast Biopsy Bilateral. Breast Mass; Local Excision Left. Cataract Surgery Bilateral. Hysterectomy (due to cancer) - Complete Knee Surgery Right. Mastectomy Left. Shoulder Surgery Right.  Diagnostic Studies History Darden Palmer, [place]; 06/13/2020 10:27 AM) Mammogram within last year Pap Smear >5 years ago  Allergies Darden Palmer, RMA; 06/13/2020 10:28 AM) Amoxicillin *PENICILLINS* Allergies Reconciled  Medication History Darden Palmer, RMA; 06/13/2020 10:30 AM) Pantoprazole Sodium (40MG  Tablet DR, Oral) Active. Telmisartan (40MG  Tablet, Oral) Active. Vitamin D (400UNIT Tablet, 500 Oral) Active. Aspirin (81MG  Tablet Chewable, Oral) Active. Zegerid (Oral) Specific strength unknown - Active. Medications Reconciled  Social History Darden Palmer, [place]; 06/13/2020 10:27 AM) Alcohol use Remotely quit alcohol use. No caffeine use No drug use Tobacco use Former smoker.  Family History Darden Palmer, [place]; 06/13/2020 10:27 AM) Arthritis Mother. Cerebrovascular Accident Mother. Heart Disease Father, Mother. Hypertension Father, Mother. Respiratory Condition Brother, Sister.  Pregnancy / Birth History Darden Palmer, [place]; 06/13/2020 10:27 AM) Age at menarche 11 years. Age of menopause 51-55 Contraceptive History Oral contraceptives. Gravida 1 Maternal age 16-25 Para 52  Other Problems Leighton Ruff. Redmond Pulling, MD; 06/13/2020 11:20 AM) Back Pain Bladder Problems Breast Cancer Cholelithiasis Gastroesophageal Reflux Disease Hemorrhoids Hypercholesterolemia Lump In Breast Oophorectomy Bilateral. Umbilical Hernia Repair     Review of Systems Darden Palmer RMA; 06/13/2020 10:27  AM) General Not Present- Appetite Loss, Chills, Fatigue, Fever, Night Sweats, Weight Gain and Weight Loss. Skin Not Present- Change in Wart/Mole, Dryness, Hives, Jaundice, New Lesions, Non-Healing Wounds, Rash and Ulcer. HEENT Not Present- Earache, Hearing Loss, Hoarseness, Nose Bleed, Oral Ulcers, Ringing in the Ears, Seasonal Allergies, Sinus Pain, Sore Throat, Visual Disturbances, Wears glasses/contact lenses and Yellow Eyes. Respiratory Not Present- Bloody sputum, Chronic Cough, Difficulty Breathing, Snoring and Wheezing. Breast Not Present- Breast Mass, Breast Pain, Nipple Discharge and Skin Changes. Cardiovascular Not Present- Chest Pain, Difficulty Breathing Lying Down, Leg Cramps, Palpitations, Rapid Heart Rate, Shortness of Breath and Swelling of Extremities. Gastrointestinal Present- Abdominal Pain. Not Present- Bloating, Bloody Stool, Change in Bowel Habits, Chronic diarrhea, Constipation, Difficulty Swallowing, Excessive gas, Gets full quickly at meals, Hemorrhoids, Indigestion, Nausea, Rectal Pain and Vomiting. Female Genitourinary Present- Frequency and Urgency. Not Present- Nocturia, Painful Urination and Pelvic Pain. Musculoskeletal Present- Back Pain. Not Present- Joint Pain, Joint Stiffness, Muscle Pain, Muscle Weakness and Swelling of Extremities. Neurological Not Present- Decreased Memory, Fainting, Headaches, Numbness, Seizures, Tingling, Tremor, Trouble walking and Weakness. Psychiatric Not Present- Anxiety, Bipolar, Change in Sleep Pattern, Depression, Fearful and Frequent crying. Endocrine Not Present- Cold Intolerance, Excessive Hunger, Hair Changes, Heat Intolerance, Hot flashes and New Diabetes. Hematology Not Present- Blood Thinners, Easy Bruising, Excessive bleeding, Gland problems, HIV and Persistent Infections.  Vitals Lattie Haw Caldwell RMA; 06/13/2020 10:30 AM) 06/13/2020 10:30 AM Weight: 188.25 lb Height: 60in Body Surface Area: 1.82 m Body Mass Index: 36.76 kg/m   Temp.: 98.65F  Pulse: 117 (Regular)  P.OX: 90% (Room air) BP: 120/76(Sitting, Left Arm, Standard)        Physical Exam Randall Hiss M. Anis Degidio MD; 06/13/2020 11:16 AM)  General Mental Status-Alert. General Appearance-Consistent with stated age. Hydration-Well hydrated. Voice-Normal.  Head and Neck Head-normocephalic, atraumatic with no lesions or palpable masses. Trachea-midline. Thyroid Gland Characteristics - normal size and consistency.  Eye Eyeball - Bilateral-Extraocular movements intact. Sclera/Conjunctiva - Bilateral-No scleral icterus.  Chest and Lung Exam Chest and lung exam reveals -quiet, even and easy respiratory effort with no use of accessory muscles and on auscultation, normal breath sounds, no adventitious sounds and normal vocal resonance. Inspection Chest Wall - Normal. Back - normal.  Breast - Did not examine.  Cardiovascular Cardiovascular examination reveals -normal heart sounds, regular rate and rhythm with no murmurs and normal pedal pulses bilaterally.  Abdomen Inspection  Inspection of the abdomen reveals: Note: I can appreciate a small belly button hernia. She is getting old umbilical trocar site. The one just below the umbilicus is the most noticeable on physical exam generally most noticeable when standing.. Soft. Nontender. Essentially reducible. I really can't appreciate the lower abdominal hernia as seen on CT. Skin - Scar - no surgical scars. Palpation/Percussion Palpation and Percussion of the abdomen reveal - Soft, Non Tender, No Rebound tenderness, No Rigidity (guarding) and No hepatosplenomegaly. Auscultation Auscultation of the abdomen reveals - Bowel sounds normal.  Peripheral Vascular Upper Extremity Palpation - Pulses bilaterally normal.  Neurologic Neurologic evaluation reveals -alert and oriented x 3 with no impairment of recent or remote memory. Mental Status-Normal.  Neuropsychiatric The patient's  mood and affect are described as -normal. Judgment and Insight-insight is appropriate concerning matters relevant to self.  Musculoskeletal Normal Exam - Left-Upper Extremity Strength Normal and Lower Extremity Strength Normal. Normal Exam - Right-Upper Extremity Strength Normal and Lower Extremity Strength Normal.  Lymphatic Head & Neck  General Head & Neck Lymphatics: Bilateral - Description - Normal. Axillary - Did not  examine. Femoral & Inguinal - Did not examine.    Assessment & Plan Randall Hiss M. Hilliard Borges MD; 01/19/164 53:74 AM)  UMBILICAL HERNIA WITHOUT OBSTRUCTION OR GANGRENE (K42.9) Impression: She has both an umbilical hernia as well as 2 smaller ventral hernias in the lower abdomen. We discussed hernias.  We discussed the etiology of ventral hernias. We discussed the signs and symptoms of incarceration and strangulation. The patient was given educational material. I also drew diagrams.  We discussed nonoperative and operative management. With respect to operative management, we discussed both open repair and laparoscopic repair & laparoscopic assisted repair with mesh. We discussed the pros and cons of each approach. I discussed the typical aftercare with each procedure and how each procedure differs.  The patient has elected to proceed with lap assisted repair with mesh (plan is for mesh around umbilicus and hernia just inferior to umbilical hernia; probably just primary muscle repair for lower abd hernia given small size; however if that one in lower abd turns out to be a high inguinal hernia - will place mesh there as well.)  We discussed the risk and benefits of surgery including but not limited to bleeding, infection, injury to surrounding structures, hernia recurrence, mesh complications, hematoma/seroma formation, need to convert to an open procedure, blood clot formation, urinary retention, post operative ileus, general anesthesia risk, long-term abdominal pain. We  discussed that this procedure can be quite uncomfortable and difficult to recover from based on how the mesh is secured to the abdominal wall. We discussed the importance of avoiding heavy lifting and straining for a period of 6 weeks.  Current Plans Pt Education - Pamphlet Given - Hernia Surgery: discussed with patient and provided information. You are being scheduled for surgery- Our schedulers will call you.  You should hear from our office's scheduling department within 5 working days about the location, date, and time of surgery. We try to make accommodations for patient's preferences in scheduling surgery, but sometimes the OR schedule or the surgeon's schedule prevents [place] from making those accommodations.  If you have not heard from our office [phone]) in 5 working days, call the office and ask for your surgeon's nurse.  If you have other questions about your diagnosis, plan, or surgery, call the office and ask for your surgeon's nurse.   VENTRAL HERNIA WITHOUT OBSTRUCTION OR GANGRENE (K43.9) Story: x2  Leighton Ruff. Redmond Pulling, MD, FACS General, Bariatric, & Minimally Invasive Surgery [facility], [place]

## 2020-07-27 NOTE — Progress Notes (Signed)
Your procedure is scheduled on Friday, October 22nd.  Report to [facility] Main Entrance "A" at 9:00 A.M., and check in at the Admitting office.  Call this number if you have problems the morning of surgery:  [phone]  Call [phone] if you have any questions prior to your surgery date Monday-Friday 8am-4pm   Remember:  Do not eat after midnight the night before your surgery  You may drink clear liquids until 8:00 A.M. the morning of your surgery.   Clear liquids allowed are: Water, Non-Citrus Juices (without pulp), Carbonated Beverages, Clear Tea, Black Coffee Only, and Gatorade   Please complete your PRE-SURGERY ENSURE that was provided to you by 8:00 A.M. the morning of surgery.  Please, if able, drink it in one setting. DO NOT SIP.   Take these medicines the morning of surgery with A SIP OF WATER  atorvastatin (LIPITOR) carboxymethylcellulose (REFRESH TEARS)/eye drops  omeprazole-sodium bicarbonate (ZEGERID  Follow your surgeon's instructions on when to stop Aspirin.  If no instructions were given by your surgeon then you will need to call the office to get those instructions.    As of today, STOP Aleve, Naproxen, Ibuprofen, Motrin, Advil, Goody's, BC's, all herbal medications, fish oil, and all vitamins.                     Do not wear jewelry, make up, or nail polish            Do not wear lotions, powders, perfumes, or deodorant.            Do not shave 48 hours prior to surgery.              Do not bring valuables to the hospital.            [facility] is not responsible for any belongings or valuables.  Do NOT Smoke (Tobacco/Vaping) or drink Alcohol 24 hours prior to your procedure If you use a CPAP at night, you may bring all equipment for your overnight stay.   Contacts, glasses, dentures or bridgework may not be worn into surgery.      For patients admitted to the hospital, discharge time will be determined by your treatment team.   Patients discharged the  day of surgery will not be allowed to drive home, and someone needs to stay with them for 24 hours.  Special instructions:   [facility]- Preparing For Surgery  Before surgery, you can play an important role. Because skin is not sterile, your skin needs to be as free of germs as possible. You can reduce the number of germs on your skin by washing with CHG (chlorahexidine gluconate) Soap before surgery.  CHG is an antiseptic cleaner which kills germs and bonds with the skin to continue killing germs even after washing.    Oral Hygiene is also important to reduce your risk of infection.  Remember - BRUSH YOUR TEETH THE MORNING OF SURGERY WITH YOUR REGULAR TOOTHPASTE  Please do not use if you have an allergy to CHG or antibacterial soaps. If your skin becomes reddened/irritated stop using the CHG.  Do not shave (including legs and underarms) for at least 48 hours prior to first CHG shower. It is OK to shave your face.  Please follow these instructions carefully.   1. Shower the NIGHT BEFORE SURGERY and the MORNING OF SURGERY with CHG Soap.   2. If you chose to wash your hair, wash your hair first as usual with  your normal shampoo.  3. After you shampoo, rinse your hair and body thoroughly to remove the shampoo.  4. Use CHG as you would any other liquid soap. You can apply CHG directly to the skin and wash gently with a scrungie or a clean washcloth.   5. Apply the CHG Soap to your body ONLY FROM THE NECK DOWN.  Do not use on open wounds or open sores. Avoid contact with your eyes, ears, mouth and genitals (private parts). Wash Face and genitals (private parts)  with your normal soap.   6. Wash thoroughly, paying special attention to the area where your surgery will be performed.  7. Thoroughly rinse your body with warm water from the neck down.  8. DO NOT shower/wash with your normal soap after using and rinsing off the CHG Soap.  9. Pat yourself dry with a CLEAN TOWEL.  10. Wear CLEAN  PAJAMAS to bed the night before surgery  11. Place CLEAN SHEETS on your bed the night of your first shower and DO NOT SLEEP WITH PETS.  Day of Surgery: Wear Clean/Comfortable clothing the morning of surgery Do not apply any deodorants/lotions.   Remember to brush your teeth WITH YOUR REGULAR TOOTHPASTE.   Please read over the following fact sheets that you were given.

## 2020-07-30 ENCOUNTER — Other Ambulatory Visit: Payer: Self-pay

## 2020-07-30 ENCOUNTER — Encounter (HOSPITAL_COMMUNITY): Payer: Self-pay

## 2020-07-30 ENCOUNTER — Encounter (HOSPITAL_COMMUNITY)
Admission: RE | Admit: 2020-07-30 | Discharge: 2020-07-30 | Disposition: A | Discharge status: Home / Self Care | Payer: Medicare Other | Source: Ambulatory Visit | Attending: General Surgery | Admitting: General Surgery

## 2020-07-30 DIAGNOSIS — I10 Essential (primary) hypertension: Secondary | ICD-10-CM | POA: Insufficient documentation

## 2020-07-30 DIAGNOSIS — Z01818 Encounter for other preprocedural examination: Secondary | ICD-10-CM | POA: Diagnosis not present

## 2020-07-30 HISTORY — DX: Other specified postprocedural states: R11.2

## 2020-07-30 HISTORY — DX: Family history of other specified conditions: Z84.89

## 2020-07-30 HISTORY — DX: Other specified postprocedural states: Z98.890

## 2020-07-30 LAB — CBC WITH DIFFERENTIAL/PLATELET
Abs Immature Granulocytes: 0.04 10*3/uL (ref 0.00–0.07)
Basophils Absolute: 0 10*3/uL (ref 0.0–0.1)
Basophils Relative: 1 %
Eosinophils Absolute: 0.1 10*3/uL (ref 0.0–0.5)
Eosinophils Relative: 1 %
HCT: 42.2 % (ref 36.0–46.0)
Hemoglobin: 13.2 g/dL (ref 12.0–15.0)
Immature Granulocytes: 1 %
Lymphocytes Relative: 23 %
Lymphs Abs: 1.6 10*3/uL (ref 0.7–4.0)
MCH: 28.2 pg (ref 26.0–34.0)
MCHC: 31.3 g/dL (ref 30.0–36.0)
MCV: 90.2 fL (ref 80.0–100.0)
Monocytes Absolute: 0.4 10*3/uL (ref 0.1–1.0)
Monocytes Relative: 6 %
Neutro Abs: 4.7 10*3/uL (ref 1.7–7.7)
Neutrophils Relative %: 68 %
Platelets: 218 10*3/uL (ref 150–400)
RBC: 4.68 MIL/uL (ref 3.87–5.11)
RDW: 13.5 % (ref 11.5–15.5)
WBC: 6.9 10*3/uL (ref 4.0–10.5)
nRBC: 0 % (ref 0.0–0.2)

## 2020-07-30 LAB — BASIC METABOLIC PANEL
Anion gap: 6 (ref 5–15)
BUN: 15 mg/dL (ref 8–23)
CO2: 32 mmol/L (ref 22–32)
Calcium: 9.4 mg/dL (ref 8.9–10.3)
Chloride: 104 mmol/L (ref 98–111)
Creatinine, Ser: 0.77 mg/dL (ref 0.44–1.00)
GFR, Estimated: >60 mL/min (ref >60)
Glucose, Bld: 92 mg/dL (ref 70–99)
Potassium: 5.1 mmol/L (ref 3.5–5.1)
Sodium: 142 mmol/L (ref 135–145)

## 2020-07-30 NOTE — Progress Notes (Signed)
PCP - Dr. Shelia Media Cardiologist - patient denies  PPM/ICD - n/a Device Orders -  Rep Notified -   Chest x-ray - n/a EKG - 07/30/20 Stress Test - patient denies ECHO - patient denies Cardiac Cath - patient denies  Sleep Study - patient denies CPAP -   Fasting Blood Sugar - n/a Checks Blood Sugar _____ times a day  Blood Thinner Instructions: n/a Aspirin Instructions: patient instructed to contact Dr. Dois Davenport office for instructions  ERAS Protcol - clears until 08:00am PRE-SURGERY Ensure or G2- Ensure provided to patient to be completed by 08:00am morning of surgery  COVID TEST- 07/31/2020 at [facility]   Anesthesia review: n/a  Patient denies shortness of breath, fever, cough and chest pain at PAT appointment   All instructions explained to the patient, with a verbal understanding of the material. Patient agrees to go over the instructions while at home for a better understanding. Patient also instructed to self quarantine after being tested for COVID-19. The opportunity to ask questions was provided.

## 2020-07-31 ENCOUNTER — Other Ambulatory Visit
Admission: RE | Admit: 2020-07-31 | Discharge: 2020-07-31 | Disposition: A | Discharge status: Home / Self Care | Payer: Medicare Other | Source: Ambulatory Visit | Attending: General Surgery | Admitting: General Surgery

## 2020-07-31 ENCOUNTER — Other Ambulatory Visit (HOSPITAL_COMMUNITY): Payer: Medicare Other

## 2020-07-31 DIAGNOSIS — Z01812 Encounter for preprocedural laboratory examination: Secondary | ICD-10-CM | POA: Diagnosis present

## 2020-07-31 DIAGNOSIS — Z20822 Contact with and (suspected) exposure to covid-19: Secondary | ICD-10-CM | POA: Diagnosis present

## 2020-07-31 LAB — SARS CORONAVIRUS 2 (TAT 6-24 HRS): SARS Coronavirus 2: NEGATIVE

## 2020-08-02 MED ORDER — BUPIVACAINE LIPOSOME 1.3 % IJ SUSP
20.0000 mL | Freq: Once | INTRAMUSCULAR | Status: AC
  Administered 2020-08-03: 20 mL
  Filled 2020-08-02 (×2): qty 20

## 2020-08-02 NOTE — Progress Notes (Signed)
Pt scheduled surgery on 08-03-20 was moved from [facility] to [place]. Pt advised to arrive at [place] at 9:00 AM. All pre-op instructions provided by [facility] hospital also applies to [facility] hospital as well. Pt verbalized understanding.

## 2020-08-03 ENCOUNTER — Other Ambulatory Visit: Payer: Self-pay

## 2020-08-03 ENCOUNTER — Telehealth (HOSPITAL_COMMUNITY): Payer: Self-pay | Admitting: *Deleted

## 2020-08-03 ENCOUNTER — Ambulatory Visit (HOSPITAL_COMMUNITY): Payer: Medicare Other | Admitting: Anesthesiology

## 2020-08-03 ENCOUNTER — Encounter (HOSPITAL_COMMUNITY): Payer: Self-pay | Admitting: General Surgery

## 2020-08-03 ENCOUNTER — Encounter (HOSPITAL_COMMUNITY)
Admission: RE | Disposition: A | Discharge status: Home / Self Care | Payer: Self-pay | Source: Home / Self Care | Attending: General Surgery

## 2020-08-03 ENCOUNTER — Observation Stay (HOSPITAL_COMMUNITY)
Admission: RE | Admit: 2020-08-03 | Discharge: 2020-08-04 | Disposition: A | Discharge status: Home / Self Care | Payer: Medicare Other | Attending: General Surgery | Admitting: General Surgery

## 2020-08-03 DIAGNOSIS — K219 Gastro-esophageal reflux disease without esophagitis: Secondary | ICD-10-CM | POA: Insufficient documentation

## 2020-08-03 DIAGNOSIS — Z9889 Other specified postprocedural states: Secondary | ICD-10-CM

## 2020-08-03 DIAGNOSIS — K449 Diaphragmatic hernia without obstruction or gangrene: Secondary | ICD-10-CM | POA: Insufficient documentation

## 2020-08-03 DIAGNOSIS — K430 Incisional hernia with obstruction, without gangrene: Principal | ICD-10-CM | POA: Insufficient documentation

## 2020-08-03 DIAGNOSIS — Z79899 Other long term (current) drug therapy: Secondary | ICD-10-CM | POA: Diagnosis not present

## 2020-08-03 DIAGNOSIS — Z87891 Personal history of nicotine dependence: Secondary | ICD-10-CM | POA: Diagnosis not present

## 2020-08-03 DIAGNOSIS — Z853 Personal history of malignant neoplasm of breast: Secondary | ICD-10-CM | POA: Insufficient documentation

## 2020-08-03 DIAGNOSIS — I10 Essential (primary) hypertension: Secondary | ICD-10-CM | POA: Insufficient documentation

## 2020-08-03 DIAGNOSIS — Z8719 Personal history of other diseases of the digestive system: Secondary | ICD-10-CM

## 2020-08-03 HISTORY — PX: VENTRAL HERNIA REPAIR: SHX424

## 2020-08-03 SURGERY — REPAIR, HERNIA, VENTRAL, LAPAROSCOPIC
Anesthesia: General | Site: Abdomen

## 2020-08-03 MED ORDER — METHYLENE BLUE 0.5 % INJ SOLN
INTRAVENOUS | Status: DC | PRN
  Administered 2020-08-03: 10 mL

## 2020-08-03 MED ORDER — PANTOPRAZOLE SODIUM 40 MG IV SOLR
40.0000 mg | Freq: Every day | INTRAVENOUS | Status: DC
  Administered 2020-08-03: 40 mg via INTRAVENOUS
  Filled 2020-08-03: qty 40

## 2020-08-03 MED ORDER — ORAL CARE MOUTH RINSE: 15.0000 mL | Freq: Once | OROMUCOSAL | Status: AC

## 2020-08-03 MED ORDER — OXYCODONE HCL 5 MG PO TABS: 5.0000 mg | ORAL_TABLET | ORAL | Status: DC | PRN

## 2020-08-03 MED ORDER — GABAPENTIN 100 MG PO CAPS
100.0000 mg | ORAL_CAPSULE | ORAL | Status: AC
  Administered 2020-08-03: 100 mg via ORAL
  Filled 2020-08-03: qty 1

## 2020-08-03 MED ORDER — AMISULPRIDE (ANTIEMETIC) 5 MG/2ML IV SOLN
10.0000 mg | Freq: Once | INTRAVENOUS | Status: AC | PRN
  Administered 2020-08-03: 10 mg via INTRAVENOUS

## 2020-08-03 MED ORDER — FENTANYL CITRATE (PF) 100 MCG/2ML IJ SOLN
INTRAMUSCULAR | Status: DC | PRN
  Administered 2020-08-03: 50 ug via INTRAVENOUS
  Administered 2020-08-03: 25 ug via INTRAVENOUS
  Administered 2020-08-03: 50 ug via INTRAVENOUS

## 2020-08-03 MED ORDER — KETOROLAC TROMETHAMINE 15 MG/ML IJ SOLN
INTRAMUSCULAR | Status: DC | PRN
  Administered 2020-08-03: 15 mg via INTRAVENOUS

## 2020-08-03 MED ORDER — EPHEDRINE 5 MG/ML INJ
INTRAVENOUS | Status: AC
  Filled 2020-08-03: qty 10

## 2020-08-03 MED ORDER — GABAPENTIN 100 MG PO CAPS: 100.0000 mg | ORAL_CAPSULE | Freq: Two times a day (BID) | ORAL | 0 refills | Status: DC

## 2020-08-03 MED ORDER — ENOXAPARIN SODIUM 40 MG/0.4ML ~~LOC~~ SOLN
40.0000 mg | SUBCUTANEOUS | Status: DC
  Administered 2020-08-04: 40 mg via SUBCUTANEOUS
  Filled 2020-08-03: qty 0.4

## 2020-08-03 MED ORDER — IRBESARTAN 150 MG PO TABS
150.0000 mg | ORAL_TABLET | Freq: Every day | ORAL | Status: DC
  Administered 2020-08-04: 150 mg via ORAL
  Filled 2020-08-03 (×2): qty 1

## 2020-08-03 MED ORDER — POLYVINYL ALCOHOL 1.4 % OP SOLN
1.0000 [drp] | Freq: Every day | OPHTHALMIC | Status: DC
  Filled 2020-08-03: qty 15

## 2020-08-03 MED ORDER — METHOCARBAMOL 500 MG PO TABS: 500.0000 mg | ORAL_TABLET | Freq: Four times a day (QID) | ORAL | Status: DC | PRN

## 2020-08-03 MED ORDER — OXYCODONE HCL 5 MG/5ML PO SOLN: 5.0000 mg | Freq: Once | ORAL | Status: DC | PRN

## 2020-08-03 MED ORDER — KCL IN DEXTROSE-NACL 20-5-0.45 MEQ/L-%-% IV SOLN
INTRAVENOUS | Status: DC
  Filled 2020-08-03: qty 1000

## 2020-08-03 MED ORDER — LIDOCAINE 2% (20 MG/ML) 5 ML SYRINGE
INTRAMUSCULAR | Status: DC | PRN
  Administered 2020-08-03: 1.5 mg/kg/h via INTRAVENOUS

## 2020-08-03 MED ORDER — BUPIVACAINE-EPINEPHRINE (PF) 0.5% -1:200000 IJ SOLN
INTRAMUSCULAR | Status: AC
  Filled 2020-08-03: qty 30

## 2020-08-03 MED ORDER — SUGAMMADEX SODIUM 200 MG/2ML IV SOLN
INTRAVENOUS | Status: DC | PRN
  Administered 2020-08-03: 200 mg via INTRAVENOUS

## 2020-08-03 MED ORDER — ACETAMINOPHEN 500 MG PO TABS
1000.0000 mg | ORAL_TABLET | Freq: Three times a day (TID) | ORAL | Status: DC
  Administered 2020-08-03 – 2020-08-04 (×3): 1000 mg via ORAL
  Filled 2020-08-03 (×3): qty 2

## 2020-08-03 MED ORDER — CHLORHEXIDINE GLUCONATE CLOTH 2 % EX PADS: 6.0000 | MEDICATED_PAD | Freq: Once | CUTANEOUS | Status: DC

## 2020-08-03 MED ORDER — ACETAMINOPHEN 500 MG PO TABS: 1000.0000 mg | ORAL_TABLET | Freq: Three times a day (TID) | ORAL | 0 refills | Status: AC

## 2020-08-03 MED ORDER — ROCURONIUM BROMIDE 10 MG/ML (PF) SYRINGE
PREFILLED_SYRINGE | INTRAVENOUS | Status: DC | PRN
  Administered 2020-08-03: 50 mg via INTRAVENOUS
  Administered 2020-08-03: 10 mg via INTRAVENOUS

## 2020-08-03 MED ORDER — AMISULPRIDE (ANTIEMETIC) 5 MG/2ML IV SOLN
INTRAVENOUS | Status: AC
  Filled 2020-08-03: qty 4

## 2020-08-03 MED ORDER — FENTANYL CITRATE (PF) 100 MCG/2ML IJ SOLN
INTRAMUSCULAR | Status: AC
  Filled 2020-08-03: qty 2

## 2020-08-03 MED ORDER — MORPHINE SULFATE (PF) 2 MG/ML IV SOLN: 1.0000 mg | INTRAVENOUS | Status: DC | PRN

## 2020-08-03 MED ORDER — CEFAZOLIN SODIUM-DEXTROSE 2-4 GM/100ML-% IV SOLN
2.0000 g | INTRAVENOUS | Status: AC
  Administered 2020-08-03: 2 g via INTRAVENOUS
  Filled 2020-08-03: qty 100

## 2020-08-03 MED ORDER — KETOROLAC TROMETHAMINE 15 MG/ML IJ SOLN
15.0000 mg | Freq: Three times a day (TID) | INTRAMUSCULAR | Status: DC
  Administered 2020-08-03 – 2020-08-04 (×2): 15 mg via INTRAVENOUS
  Filled 2020-08-03 (×2): qty 1

## 2020-08-03 MED ORDER — ENSURE PRE-SURGERY PO LIQD
296.0000 mL | Freq: Once | ORAL | Status: DC
  Filled 2020-08-03: qty 296

## 2020-08-03 MED ORDER — ONDANSETRON HCL 4 MG/2ML IJ SOLN
4.0000 mg | Freq: Four times a day (QID) | INTRAMUSCULAR | Status: DC | PRN
  Administered 2020-08-03: 4 mg via INTRAVENOUS
  Filled 2020-08-03: qty 2

## 2020-08-03 MED ORDER — DIPHENHYDRAMINE HCL 50 MG/ML IJ SOLN: 12.5000 mg | Freq: Four times a day (QID) | INTRAMUSCULAR | Status: DC | PRN

## 2020-08-03 MED ORDER — PROPOFOL 10 MG/ML IV BOLUS
INTRAVENOUS | Status: DC | PRN
  Administered 2020-08-03: 150 mg via INTRAVENOUS
  Administered 2020-08-03: 40 mg via INTRAVENOUS

## 2020-08-03 MED ORDER — SIMETHICONE 80 MG PO CHEW: 40.0000 mg | CHEWABLE_TABLET | Freq: Four times a day (QID) | ORAL | Status: DC | PRN

## 2020-08-03 MED ORDER — PHENYLEPHRINE 40 MCG/ML (10ML) SYRINGE FOR IV PUSH (FOR BLOOD PRESSURE SUPPORT)
PREFILLED_SYRINGE | INTRAVENOUS | Status: DC | PRN
  Administered 2020-08-03: 120 ug via INTRAVENOUS
  Administered 2020-08-03: 80 ug via INTRAVENOUS
  Administered 2020-08-03: 40 ug via INTRAVENOUS
  Administered 2020-08-03: 80 ug via INTRAVENOUS

## 2020-08-03 MED ORDER — KETOROLAC TROMETHAMINE 30 MG/ML IJ SOLN
INTRAMUSCULAR | Status: AC
  Filled 2020-08-03: qty 1

## 2020-08-03 MED ORDER — LIDOCAINE 2% (20 MG/ML) 5 ML SYRINGE
INTRAMUSCULAR | Status: AC
  Filled 2020-08-03: qty 10

## 2020-08-03 MED ORDER — OXYCODONE HCL 5 MG PO TABS: 5.0000 mg | ORAL_TABLET | Freq: Once | ORAL | Status: DC | PRN

## 2020-08-03 MED ORDER — ACETAMINOPHEN 500 MG PO TABS: 1000.0000 mg | ORAL_TABLET | ORAL | Status: DC

## 2020-08-03 MED ORDER — ONDANSETRON HCL 4 MG/2ML IJ SOLN
INTRAMUSCULAR | Status: DC | PRN
  Administered 2020-08-03: 4 mg via INTRAVENOUS

## 2020-08-03 MED ORDER — FENTANYL CITRATE (PF) 100 MCG/2ML IJ SOLN: 25.0000 ug | INTRAMUSCULAR | Status: DC | PRN

## 2020-08-03 MED ORDER — DOCUSATE SODIUM 100 MG PO CAPS
100.0000 mg | ORAL_CAPSULE | Freq: Two times a day (BID) | ORAL | Status: DC
  Administered 2020-08-03 – 2020-08-04 (×2): 100 mg via ORAL
  Filled 2020-08-03 (×2): qty 1

## 2020-08-03 MED ORDER — ONDANSETRON HCL 4 MG/2ML IJ SOLN: 4.0000 mg | Freq: Once | INTRAMUSCULAR | Status: DC | PRN

## 2020-08-03 MED ORDER — CHLORHEXIDINE GLUCONATE 0.12 % MT SOLN
15.0000 mL | Freq: Once | OROMUCOSAL | Status: AC
  Administered 2020-08-03: 15 mL via OROMUCOSAL

## 2020-08-03 MED ORDER — EPHEDRINE SULFATE-NACL 50-0.9 MG/10ML-% IV SOSY
PREFILLED_SYRINGE | INTRAVENOUS | Status: DC | PRN
  Administered 2020-08-03: 10 mg via INTRAVENOUS

## 2020-08-03 MED ORDER — OXYCODONE HCL 5 MG PO TABS
5.0000 mg | ORAL_TABLET | Freq: Four times a day (QID) | ORAL | 0 refills | Status: DC | PRN
Start: 2020-08-03 — End: 2021-03-15

## 2020-08-03 MED ORDER — METHYLENE BLUE 0.5 % INJ SOLN
INTRAVENOUS | Status: AC
  Filled 2020-08-03: qty 10

## 2020-08-03 MED ORDER — 0.9 % SODIUM CHLORIDE (POUR BTL) OPTIME
TOPICAL | Status: DC | PRN
  Administered 2020-08-03: 1000 mL

## 2020-08-03 MED ORDER — GABAPENTIN 100 MG PO CAPS
100.0000 mg | ORAL_CAPSULE | Freq: Two times a day (BID) | ORAL | Status: DC
  Administered 2020-08-03 – 2020-08-04 (×2): 100 mg via ORAL
  Filled 2020-08-03 (×2): qty 1

## 2020-08-03 MED ORDER — SCOPOLAMINE 1 MG/3DAYS TD PT72
1.0000 | MEDICATED_PATCH | Freq: Once | TRANSDERMAL | Status: DC
  Administered 2020-08-03: 1.5 mg via TRANSDERMAL
  Filled 2020-08-03: qty 1

## 2020-08-03 MED ORDER — BUPIVACAINE-EPINEPHRINE (PF) 0.5% -1:200000 IJ SOLN
INTRAMUSCULAR | Status: DC | PRN
  Administered 2020-08-03: 30 mL

## 2020-08-03 MED ORDER — CARBOXYMETHYLCELLULOSE SODIUM 0.5 % OP SOLN: 1.0000 [drp] | Freq: Every day | OPHTHALMIC | Status: DC

## 2020-08-03 MED ORDER — DEXAMETHASONE SODIUM PHOSPHATE 4 MG/ML IJ SOLN
INTRAMUSCULAR | Status: DC | PRN
  Administered 2020-08-03: 8 mg via INTRAVENOUS

## 2020-08-03 MED ORDER — ACETAMINOPHEN 500 MG PO TABS
1000.0000 mg | ORAL_TABLET | Freq: Once | ORAL | Status: AC
  Administered 2020-08-03: 1000 mg via ORAL
  Filled 2020-08-03: qty 2

## 2020-08-03 MED ORDER — DIPHENHYDRAMINE HCL 12.5 MG/5ML PO ELIX: 12.5000 mg | ORAL_SOLUTION | Freq: Four times a day (QID) | ORAL | Status: DC | PRN

## 2020-08-03 MED ORDER — LACTATED RINGERS IV SOLN: INTRAVENOUS | Status: DC

## 2020-08-03 MED ORDER — LIDOCAINE 2% (20 MG/ML) 5 ML SYRINGE
INTRAMUSCULAR | Status: DC | PRN
  Administered 2020-08-03: 60 mg via INTRAVENOUS

## 2020-08-03 MED ORDER — ONDANSETRON 4 MG PO TBDP: 4.0000 mg | ORAL_TABLET | Freq: Four times a day (QID) | ORAL | Status: DC | PRN

## 2020-08-03 MED ORDER — STERILE WATER FOR IRRIGATION IR SOLN
Status: DC | PRN
  Administered 2020-08-03: 250 mL

## 2020-08-03 SURGICAL SUPPLY — 45 items
ADH SKN CLS APL DERMABOND .7 (GAUZE/BANDAGES/DRESSINGS) ×1
APL PRP STRL LF DISP 70% ISPRP (MISCELLANEOUS) ×1
APL SKNCLS STERI-STRIP NONHPOA (GAUZE/BANDAGES/DRESSINGS)
BENZOIN TINCTURE PRP APPL 2/3 (GAUZE/BANDAGES/DRESSINGS) IMPLANT
BINDER ABDOMINAL 12 ML 46-62 (SOFTGOODS) ×1 IMPLANT
CABLE HIGH FREQUENCY MONO STRZ (ELECTRODE) ×2 IMPLANT
CHLORAPREP W/TINT 26 (MISCELLANEOUS) ×2 IMPLANT
COVER SURGICAL LIGHT HANDLE (MISCELLANEOUS) ×2 IMPLANT
COVER WAND RF STERILE (DRAPES) IMPLANT
DECANTER SPIKE VIAL GLASS SM (MISCELLANEOUS) ×2 IMPLANT
DERMABOND ADVANCED (GAUZE/BANDAGES/DRESSINGS) ×1
DERMABOND ADVANCED .7 DNX12 (GAUZE/BANDAGES/DRESSINGS) IMPLANT
DEVICE SECURE STRAP 25 ABSORB (INSTRUMENTS) ×2 IMPLANT
DEVICE TROCAR PUNCTURE CLOSURE (ENDOMECHANICALS) ×2 IMPLANT
DRAIN CHANNEL 19F RND (DRAIN) IMPLANT
DRAPE INCISE IOBAN 66X45 STRL (DRAPES) IMPLANT
ELECT PENCIL ROCKER SW 15FT (MISCELLANEOUS) IMPLANT
EVACUATOR SILICONE 100CC (DRAIN) IMPLANT
GOWN STRL REUS W/TWL XL LVL3 (GOWN DISPOSABLE) ×6 IMPLANT
KIT BASIN OR (CUSTOM PROCEDURE TRAY) ×2 IMPLANT
L-HOOK LAP DISP 36CM (ELECTROSURGICAL)
LHOOK LAP DISP 36CM (ELECTROSURGICAL) IMPLANT
MARKER SKIN DUAL TIP RULER LAB (MISCELLANEOUS) ×2 IMPLANT
MESH VENTRALIGHT ST 6X8 (Mesh Specialty) ×2 IMPLANT
MESH VENTRLGHT ELLIPSE 8X6XMFL (Mesh Specialty) IMPLANT
NDL SPNL 22GX3.5 QUINCKE BK (NEEDLE) ×1 IMPLANT
NEEDLE SPNL 22GX3.5 QUINCKE BK (NEEDLE) ×2 IMPLANT
SCISSORS LAP 5X35 DISP (ENDOMECHANICALS) ×1 IMPLANT
SET IRRIG TUBING LAPAROSCOPIC (IRRIGATION / IRRIGATOR) IMPLANT
SET IRRIG Y TYPE TUR BLADDER L (SET/KITS/TRAYS/PACK) ×1 IMPLANT
SET TUBE SMOKE EVAC HIGH FLOW (TUBING) ×2 IMPLANT
SHEARS HARMONIC ACE PLUS 36CM (ENDOMECHANICALS) IMPLANT
SLEEVE XCEL OPT CAN 5 100 (ENDOMECHANICALS) ×3 IMPLANT
STRIP CLOSURE SKIN 1/2X4 (GAUZE/BANDAGES/DRESSINGS) IMPLANT
SUT ETHILON 2 0 PS N (SUTURE) IMPLANT
SUT MNCRL AB 4-0 PS2 18 (SUTURE) ×2 IMPLANT
SUT NOVA 1 T20/GS 25DT (SUTURE) ×1 IMPLANT
SUT VIC AB 3-0 SH 18 (SUTURE) IMPLANT
SUT VICRYL 0 UR6 27IN ABS (SUTURE) ×1 IMPLANT
TOWEL OR 17X26 10 PK STRL BLUE (TOWEL DISPOSABLE) ×2 IMPLANT
TRAY FOLEY MTR SLVR 14FR STAT (SET/KITS/TRAYS/PACK) ×1 IMPLANT
TRAY LAPAROSCOPIC (CUSTOM PROCEDURE TRAY) ×2 IMPLANT
TROCAR BLADELESS OPT 5 100 (ENDOMECHANICALS) ×2 IMPLANT
TROCAR XCEL 12X100 BLDLESS (ENDOMECHANICALS) ×1 IMPLANT
TROCAR XCEL NON-BLD 11X100MML (ENDOMECHANICALS) IMPLANT

## 2020-08-03 NOTE — H&P (Signed)
CC: here for surgery  Requesting provider: Dr Paulita Fujita  HPI: Selena Cruz is an 74 y.o. female who is here for repair of her ventral hernias.  She has 2 fascial defects around the umbilicus and a lower abdominal small fascial defect.  She denies any changes since I saw her in the clinic about a month and a half ago.  The patient is a 74 year old female who presents with an abdominal wall hernia. She is referred by Dr. Paulita Fujita for evaluation of a ventral hernia. She states that she noticed a knot in her abdominal wall below her umbilicus. She pointed out to her oncologist who ordered a CT scan which revealed a ventral hernia containing fat. She saw her PCP who then sent her to GI medicine. Because also the CT scan showed some mild terminal ileitis. She states that the knot is uncomfortable especially at night. She can't wear tight clothing across her lower abdomen. She also wonders if there is another one in her right lower quadrant. She has had a prior laparoscopic surgery for ovarian surgery. She has had a vaginal hysterectomy. The denies any other abdominal surgeries. At times the knots can be uncomfortable. She denies any nausea, vomiting, diarrhea or constipation. She denies any loose stool. She denies any history of IBS. She denies any chest pain or chest pressure. She does have a moderate hiatal hernia and can have heartburn and therefore she sleeps upright. She denies any weight loss. She quit smoking more than 10 years ago. 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 She denies any TIAs or angina  I reviewed her CT scan she does have a moderate hiatal hernia. She has 3 ventral hernias. One of the umbilicus measuring about 1.6 cm vertical by about 1.5 wide. She has a little bit of intact fascia just below that and then another visit ventral hernia measuring 1.8 glide by 1.3 cm vertical. In the lower abdomen all to the midline there another other fascial  defect about 1.4 cm vertical by 1.2 cm wide. It looks like a portion the bladder may go up to her go through it  Past Medical History:  Diagnosis Date  . Cancer [facility])    breast CA  . Family history of adverse reaction to anesthesia    post-op nausea and vomiting  . GERD (gastroesophageal reflux disease)   . Hiatal hernia   . Hypertension   . PONV (postoperative nausea and vomiting)     Past Surgical History:  Procedure Laterality Date  . ABDOMINAL HYSTERECTOMY    . BRAVO [place] STUDY N/A 01/31/2013   Procedure: BRAVO [place];  Surgeon: Arta Silence, MD;  Location: [facility] ENDOSCOPY;  Service: Endoscopy;  Laterality: N/A;  . BREAST EXCISIONAL BIOPSY Right 1991  . COLONOSCOPY    . ESOPHAGOGASTRODUODENOSCOPY N/A 01/31/2013   Procedure: ESOPHAGOGASTRODUODENOSCOPY (EGD);  Surgeon: Arta Silence, MD;  Location: [facility];  Service: Endoscopy;  Laterality: N/A;  . EXCISION / BIOPSY BREAST / NIPPLE / DUCT Left 1999  . KNEE ARTHROSCOPY W/ MENISCAL REPAIR Right 2019  . MASTECTOMY     partial on left  . ROTATOR CUFF REPAIR      Family History  Problem Relation Age of Onset  . Heart attack Mother   . Heart failure Father   . Breast cancer Sister   . Heart failure Brother   . CAD Brother   . Breast cancer Maternal Aunt     Social:  reports that  she quit smoking about 10 years ago. Her smoking use included cigarettes. She started smoking about 60 years ago. She has a 50.00 pack-year smoking history. She has never used smokeless tobacco. She reports that she does not drink alcohol and does not use drugs.  Allergies: No Known Allergies  Medications: I have reviewed the patient's current medications.  No results found for this or any previous visit (from the past 48 hour(s)).  No results found.  ROS - all of the below systems have been reviewed with the patient and positives are indicated with bold text General: chills, fever or night sweats Eyes: blurry vision or double vision ENT:  epistaxis or sore throat Allergy/Immunology: itchy/watery eyes or nasal congestion Hematologic/Lymphatic: bleeding problems, blood clots or swollen lymph nodes Endocrine: temperature intolerance or unexpected weight changes Breast: new or changing breast lumps or nipple discharge Resp: cough, shortness of breath, or wheezing CV: chest pain or dyspnea on exertion GI: as per HPI GU: dysuria, trouble voiding, or hematuria MSK: joint pain or joint stiffness Neuro: TIA or stroke symptoms Derm: pruritus and skin lesion changes Psych: anxiety and depression  PE There were no vitals taken for this visit. Constitutional: NAD; conversant; no deformities Eyes: Moist conjunctiva; no lid lag; anicteric; PERRL Neck: Trachea midline; no thyromegaly Lungs: Normal respiratory effort; no tactile fremitus CV: RRR; no palpable thrills; no pitting edema GI: Abd soft, mild obesity; old incisions, small bulge around umbilicus; no palpable hepatosplenomegaly MSK: Normal gait; no clubbing/cyanosis Psychiatric: Appropriate affect; alert and oriented x3 Lymphatic: No palpable cervical or axillary lymphadenopathy Skin:no rash/lesions/induration  No results found for this or any previous visit (from the past 55 hour(s)).  No results found.  Imaging: reviewed  A/P: Selena Cruz is an 74 y.o. female with  Umbilical hernia Ventral hernia x 2 Mild obesity htn  2 OR for laparoscopic assisted repair of her ventral hernias and umbilical hernia with mesh.  The lower abdominal ventral hernia may contain a portion of her bladder.  We may just do a primary repair that location since it is a small fascial defect.  I discussed the resident's involvement with her surgery with the patient preoperatively.  Eras protocol IV abx All questions asked and answered  Leighton Ruff. Redmond Pulling, MD, FACS General, Bariatric, & Minimally Invasive Surgery [facility], [place]

## 2020-08-03 NOTE — Anesthesia Postprocedure Evaluation (Signed)
Anesthesia Post Note  Patient: Selena Cruz  Procedure(s) Performed: LAPAROSCOPIC PRIMARY INCISIONAL HERNIA REPAIR, LAPAROSCOPIC REPAIR OF INFCISIONAL HERNIA WITH MESH (N/A Abdomen)     Patient location during evaluation: PACU Anesthesia Type: General Level of consciousness: awake and alert, oriented and patient cooperative Pain management: pain level controlled Vital Signs Assessment: post-procedure vital signs reviewed and stable Respiratory status: spontaneous breathing, nonlabored ventilation and respiratory function stable Cardiovascular status: blood pressure returned to baseline and stable Postop Assessment: no apparent nausea or vomiting Anesthetic complications: no   No complications documented.  Last Vitals:  Vitals:   08/03/20 1230 08/03/20 1245  BP: (!) 147/89 (!) 156/84  Pulse: 88 84  Resp: 20 (!) 21  Temp:    SpO2: 96% 92%    Last Pain:  Vitals:   08/03/20 1230  TempSrc:   PainSc: 0-No pain                 Pervis Hocking

## 2020-08-03 NOTE — Discharge Instructions (Signed)
[facility] LAPAROSCOPIC SURGERY: POST OP INSTRUCTIONS Always review your discharge instruction sheet given to you by the facility where your surgery was performed. IF YOU HAVE DISABILITY OR FAMILY LEAVE FORMS, YOU MUST BRING THEM TO THE OFFICE FOR PROCESSING.   DO NOT GIVE THEM TO YOUR DOCTOR.  PAIN CONTROL  1. First take acetaminophen (Tylenol) AND/or ibuprofen (Advil) to control your pain after surgery.  Follow directions on package.  Taking acetaminophen (Tylenol) and/or ibuprofen (Advil) regularly after surgery will help to control your pain and lower the amount of prescription pain medication you may need.  You should not take more than 3,000 mg (3 grams) of acetaminophen (Tylenol) in 24 hours.  You should not take ibuprofen (Advil), aleve, motrin, naprosyn or other NSAIDS if you have a history of stomach ulcers or chronic kidney disease.  2. A prescription for pain medication may be given to you upon discharge.  Take your pain medication as prescribed, if you still have uncontrolled pain after taking acetaminophen (Tylenol) or ibuprofen (Advil). 3. Use ice packs to help control pain. 4. If you need a refill on your pain medication, please contact your pharmacy.  They will contact our office to request authorization. Prescriptions will not be filled after 5pm or on week-ends.  HOME MEDICATIONS 5. Take your usually prescribed medications unless otherwise directed.  DIET 6. You should follow a light diet the first few days after arrival home.  Be sure to include lots of fluids daily. Avoid fatty, fried foods.   CONSTIPATION 7. It is common to experience some constipation after surgery and if you are taking pain medication.  Increasing fluid intake and taking a stool softener (such as Colace) will usually help or prevent this problem from occurring.  A mild laxative (Milk of Magnesia or Miralax) should be taken according to package instructions if there are no bowel  movements after 48 hours.  WOUND/INCISION CARE 8. Most patients will experience some swelling and bruising in the area of the incisions.  Ice packs will help.  Swelling and bruising can take several days to resolve.  9. Unless discharge instructions indicate otherwise, follow guidelines below  a. STERI-STRIPS - you may remove your outer bandages 48 hours after surgery, and you may shower at that time.  You have steri-strips (small skin tapes) in place directly over the incision.  These strips should be left on the skin for 7-10 days.   b. DERMABOND/SKIN GLUE - you may shower in 24 hours.  The glue will flake off over the next 2-3 weeks. 10. Any sutures or staples will be removed at the office during your follow-up visit.  ACTIVITIES 11. You may resume regular (light) daily activities beginning the next day--such as daily self-care, walking, climbing stairs--gradually increasing activities as tolerated.  You may have sexual intercourse when it is comfortable.  Refrain from any heavy lifting or straining until approved by your doctor. a. You may drive when you are no longer taking prescription pain medication, you can comfortably wear a seatbelt, and you can safely maneuver your car and apply brakes.  FOLLOW-UP 12. You should see your doctor in the office for a follow-up appointment approximately 2-3 weeks after your surgery.  You should have been given your post-op/follow-up appointment when your surgery was scheduled.  If you did not receive a post-op/follow-up appointment, make sure that you call for this appointment within a day or two after you arrive home to insure a convenient appointment time.  OTHER  INSTRUCTIONS 13. WEAR ABDOMINAL BINDER FOR COMFORT 14. DO NOT LIFT/PUSH/PULL ANYTHING GREATER THAN 10 POUNDS FOR 4 WEEKS  WHEN TO CALL YOUR DOCTOR: 1. Fever over 101.0 2. Inability to urinate 3. Continued bleeding from incision. 4. Increased pain, redness, or drainage from the  incision. 5. Increasing abdominal pain  The clinic staff is available to answer your questions during regular business hours.  Please don't hesitate to call and ask to speak to one of the nurses for clinical concerns.  If you have a medical emergency, go to the nearest emergency room or call 911.  A surgeon from [facility] is always on call at the hospital. [address] [place], [place], [place]  [address] ? [address], [place], [place]   [phone] ? [phone] ? FAX [phone] Web site: [email or web address]  ........Marland Kitchen   Managing Your Pain After Surgery Without Opioids    Thank you for participating in our program to help patients manage their pain after surgery without opioids. This is part of our effort to provide you with the best care possible, without exposing you or your family to the risk that opioids pose.  What pain can I expect after surgery? You can expect to have some pain after surgery. This is normal. The pain is typically worse the day after surgery, and quickly begins to get better. Many studies have found that many patients are able to manage their pain after surgery with Over-the-Counter (OTC) medications such as Tylenol and Motrin. If you have a condition that does not allow you to take Tylenol or Motrin, notify your surgical team.  How will I manage my pain? The best strategy for controlling your pain after surgery is around the clock pain control with Tylenol (acetaminophen) and Motrin (ibuprofen or Advil). Alternating these medications with each other allows you to maximize your pain control. In addition to Tylenol and Motrin, you can use heating pads or ice packs on your incisions to help reduce your pain.  How will I alternate your regular strength over-the-counter pain medication? You will take a dose of pain medication every three hours. ; Start by taking 650 mg of Tylenol (2 pills of 325 mg) ; 3 hours later  take 600 mg of Motrin (3 pills of 200 mg) ; 3 hours after taking the Motrin take 650 mg of Tylenol ; 3 hours after that take 600 mg of Motrin.   - 1 -  See example - if your first dose of Tylenol is at 12:00 PM   12:00 PM Tylenol 650 mg (2 pills of 325 mg)  3:00 PM Motrin 600 mg (3 pills of 200 mg)  6:00 PM Tylenol 650 mg (2 pills of 325 mg)  9:00 PM Motrin 600 mg (3 pills of 200 mg)  Continue alternating every 3 hours   We recommend that you follow this schedule around-the-clock for at least 3 days after surgery, or until you feel that it is no longer needed. Use the table on the last page of this handout to keep track of the medications you are taking. Important: Do not take more than 3000mg  of Tylenol or 1800mg  of Motrin in a 24-hour period. Do not take ibuprofen/Motrin if you have a history of bleeding stomach ulcers, severe kidney disease, &/or actively taking a blood thinner  What if I still have pain? If you have pain that is not controlled with the over-the-counter pain medications (Tylenol and Motrin or Advil) you might have what  we call "breakthrough" pain. You will receive a prescription for a small amount of an opioid pain medication such as Oxycodone, Tramadol, or Tylenol with Codeine. Use these opioid pills in the first 24 hours after surgery if you have breakthrough pain. Do not take more than 1 pill every 4-6 hours.  If you still have uncontrolled pain after using all opioid pills, don't hesitate to call our staff using the number provided. We will help make sure you are managing your pain in the best way possible, and if necessary, we can provide a prescription for additional pain medication.   Day 1    Time  Name of Medication Number of pills taken  Amount of Acetaminophen  Pain Level   Comments  AM PM       AM PM       AM PM       AM PM       AM PM       AM PM       AM PM       AM PM       Total Daily amount of Acetaminophen Do not take more than  3,000  mg per day      Day 2    Time  Name of Medication Number of pills taken  Amount of Acetaminophen  Pain Level   Comments  AM PM       AM PM       AM PM       AM PM       AM PM       AM PM       AM PM       AM PM       Total Daily amount of Acetaminophen Do not take more than  3,000 mg per day      Day 3    Time  Name of Medication Number of pills taken  Amount of Acetaminophen  Pain Level   Comments  AM PM       AM PM       AM PM       AM PM          AM PM       AM PM       AM PM       AM PM       Total Daily amount of Acetaminophen Do not take more than  3,000 mg per day      Day 4    Time  Name of Medication Number of pills taken  Amount of Acetaminophen  Pain Level   Comments  AM PM       AM PM       AM PM       AM PM       AM PM       AM PM       AM PM       AM PM       Total Daily amount of Acetaminophen Do not take more than  3,000 mg per day      Day 5    Time  Name of Medication Number of pills taken  Amount of Acetaminophen  Pain Level   Comments  AM PM       AM PM       AM PM       AM PM  AM PM       AM PM       AM PM       AM PM       Total Daily amount of Acetaminophen Do not take more than  3,000 mg per day       Day 6    Time  Name of Medication Number of pills taken  Amount of Acetaminophen  Pain Level  Comments  AM PM       AM PM       AM PM       AM PM       AM PM       AM PM       AM PM       AM PM       Total Daily amount of Acetaminophen Do not take more than  3,000 mg per day      Day 7    Time  Name of Medication Number of pills taken  Amount of Acetaminophen  Pain Level   Comments  AM PM       AM PM       AM PM       AM PM       AM PM       AM PM       AM PM       AM PM       Total Daily amount of Acetaminophen Do not take more than  3,000 mg per day        For additional information about how and where to safely dispose of unused opioid medications -  [employer].com.br  Disclaimer: This document contains information and/or instructional materials adapted from [place] for the typical patient with your condition. It does not replace medical advice from your health care provider because your experience may differ from that of the typical patient. Talk to your health care provider if you have any questions about this document, your condition or your treatment plan. Adapted from [place]

## 2020-08-03 NOTE — Anesthesia Procedure Notes (Signed)
Procedure Name: Intubation Date/Time: 08/03/2020 10:36 AM Performed by: Lieutenant Diego, CRNA Pre-anesthesia Checklist: Patient identified, Emergency Drugs available, Suction available and Patient being monitored Patient Re-evaluated:Patient Re-evaluated prior to induction Oxygen Delivery Method: Circle system utilized Preoxygenation: Pre-oxygenation with 100% oxygen Induction Type: IV induction Ventilation: Mask ventilation without difficulty Laryngoscope Size: Miller and 2 Grade View: Grade I Tube type: Oral Tube size: 7.0 mm Number of attempts: 1 Airway Equipment and Method: Stylet Placement Confirmation: ETT inserted through vocal cords under direct vision,  positive ETCO2 and breath sounds checked- equal and bilateral Secured at: 23 cm Tube secured with: Tape Dental Injury: Teeth and Oropharynx as per pre-operative assessment

## 2020-08-03 NOTE — Op Note (Signed)
Laparoscopic Primary repair of incarcerated incisional hernia  & Laparoscopic repair of incisional hernias with mesh Repair Procedure Note; laparoscopic bilateral tap block  Indications: Symptomatic ventral incisional hernias.  A preoperative CT showed 2 fascial defects around the umbilicus.  She also had a fascial defect in the lower midline that was small but it was containing what appeared to be a tongue of bladder.   Pre-operative Diagnosis: ventral incisional hernias x 3 (2 around the umbilicus, 1 in lower midline containing portion of bladder)  Post-operative Diagnosis: ventral incisional hernias x2; incarcerated incisional hernia with small portion of bladder and fat tissue  Surgeon: Greer Pickerel MD FACS  Surgical Resident: Eliot Ford MD I was personally present during the entire procedure except skin closure, I took down adhesions around lower midline incisional hernia and mobilized/reduced the bladder.   Anesthesia: General endotracheal anesthesia  Procedure Details  The patient was seen in the Holding Room. The risks, benefits, complications, treatment options, and expected outcomes were discussed with the patient. The possibilities of reaction to medication, pulmonary aspiration, perforation of viscus, bleeding, recurrent infection, the need for additional procedures, failure to diagnose a condition, and creating a complication requiring transfusion or operation were discussed with the patient. The patient concurred with the proposed plan, giving informed consent.  The site of surgery properly noted/marked. The patient was taken to the operating room, identified as Selena Cruz and the procedure verified as laparoscopic ventral hernia repair with mesh. A Time Out was held and the above information confirmed.    The patient was placed supine.  After establishing general anesthesia, a Foley catheter was placed.  The abdomen was prepped with Chloraprep and draped in standard  fashion.  A 5 mm Optiview was used the cannulate the peritoneal cavity in the left upper quadrant below the costal margin.  Pneumoperitoneum was obtained by insufflating CO2, maintaining a maximum pressure of 15 mmHg.  The 5 mm 30-degree laparoscopic was inserted.  There were a few omental adhesions to the anterior abdominal wall in and around the hernia defects around the umbilicus.  An 12-mm port was placed in the left anterior axillary line at the level of the umbilicus.  Another 5-mm port was placed in the left lower quadrant.  The resident used EndoShears with cautery and gentle traction with a grasper were used to dissect the omental adhesions away from the anterior abdominal wall around the umbilical defects.  Some fat tissue was reduced from the supraumbilical defect and discarded..  We cleared the entire abdominal wall and were able to visualize two fascial defects around the umbilicus.  We then looked in the lower midline.  There appeared to be a small fascial defect in the lower midline.  There appeared to be decompressed bladder going up into the defect.  At this point I took over the dissection of this location.  I was able to identify an avascular plane in the preperitoneal space.  It was gently incised with EndoShears with electrocautery.  I was then able to gently and bluntly dissect this tissue away from the anterior abdominal wall.  Using graspers I was able to reduce the herniated tissue through the small fascial defect this was a portion of fat tissue as well as what appeared to be a portion of the lateral wall.  I freed the surrounding abdominal wall for about 1-1/2 cm in each direction.  The bladder was then densely adhered to the suprapubic area.  I did not extend the dissection or  mobilization any inferiorly.  Since this defect only measured 1 cm x 1 cm I decided to do a primary repair using a Endo Close with 2 interrupted 0 Novafil sutures.  Exparel and Marcaine were infiltrated in this  area.  I did not place mesh in this area because of the patient's age as well as the fact that we would have to do an extensive mobilization of the bladder and I was concerned about entering the bladder.  We then turned our attention back to the periumbilical fascial defects.  We used a spinal needle to identify the extent of the hernia defects.  This covered an area of about 5 x 4 cms. we then added 5 cm in each direction and this came up with a total measurement of around 12 cm wide by 18 cm vertical so therefore we selected a 15 x 20 cm piece of mesh.  Skin was marked with a marking pen where the edges of the mention overlapped the abdominal wall.  We selected a oval piece of  Bard ventralightST mesh (15 x 20 cm).  We placed 1 central stay suture of 0 Novofil.  The mesh was then rolled up and inserted through the 12 mm port site.  The mesh was then unrolled.  We did take down some of the falciform ligament with EndoShears with cautery.  The central stay suture was then pulled up through small stab incision using the Endo-close device.  This deployed the mesh widely over the fascial defects.  The stay suture was then tied down.  The Secure Strap device was then used to tack down the edges of the mesh at 1 cm intervals circumferentially.  We placed a few tacks inside the outer ring of tacks.  We inspected for hemostasis.  A bilateral laparoscopic tap block was performed with a combination of Marcaine and Exparel using laparoscopic guidance.  I also put local around the edges of the mesh where the tacks had been placed.  At this point we had the nurse to still the bladder with 400 cc of saline with methylene blue.  The bladder distended.  There was no evidence of leak from where we had mobilized the herniated portion of the time of the bladder.  The fascial defect at the 12 mm port site was closed with a 0 Vicryl using the Endoclose device.  Pneumoperitoneum was then released as we removed the remainder of the  trocars.  The port sites were closed with 4-0 Monocryl.  All of the incisions and stay suture sites were then sealed with Dermabond.  An abdominal binder was placed around the patient's abdomen.  The patient was extubated and brought to the recovery room in stable condition.  All sponge, instrument, and needle counts were correct prior to closure and at the conclusion of the case.   Findings: Type of repair - primary suture and Mesh   Name of mesh - ventralightSt  Size of mesh - Length 20 cm, Width 15 cm  Mesh overlap - 5 cm  Placement of mesh - beneath fascia and into peritoneal cavity,    Estimated Blood Loss:  Minimal         Complications:  None; patient tolerated the procedure well.         Disposition: PACU - hemodynamically stable.         Condition: stable  Leighton Ruff. Redmond Pulling, MD, FACS General, Bariatric, & Minimally Invasive Surgery [facility], [place]

## 2020-08-03 NOTE — Anesthesia Preprocedure Evaluation (Addendum)
Anesthesia Evaluation  Patient identified by MRN, date of birth, ID band Patient awake    Reviewed: Allergy & Precautions, NPO status , Patient's Chart, lab work & pertinent test results  History of Anesthesia Complications (+) PONV and history of anesthetic complications  Airway Mallampati: II  TM Distance: >3 FB Neck ROM: Full    Dental no notable dental hx. (+) Teeth Intact, Dental Advisory Given,    Pulmonary former smoker,  Quit smoking 2010, 50 pack year history    Pulmonary exam normal breath sounds clear to auscultation       Cardiovascular hypertension, Pt. on medications Normal cardiovascular exam Rhythm:Regular Rate:Normal     Neuro/Psych negative neurological ROS  negative psych ROS   GI/Hepatic Neg liver ROS, hiatal hernia, GERD  Medicated and Controlled,Umbilical, ventral hernia    Endo/Other  negative endocrine ROShct 42, plt 218  Renal/GU negative Renal ROS  negative genitourinary   Musculoskeletal negative musculoskeletal ROS (+)   Abdominal   Peds  Hematology negative hematology ROS (+)   Anesthesia Other Findings Hx breast ca   Reproductive/Obstetrics negative OB ROS                            Anesthesia Physical Anesthesia Plan  ASA: II  Anesthesia Plan: General   Post-op Pain Management:    Induction: Intravenous  PONV Risk Score and Plan: 4 or greater and Ondansetron, Dexamethasone, Midazolam, Scopolamine patch - Pre-op, Treatment may vary due to age or medical condition and Propofol infusion  Airway Management Planned: Oral ETT  Additional Equipment: None  Intra-op Plan:   Post-operative Plan: Extubation in OR  Informed Consent: I have reviewed the patients History and Physical, chart, labs and discussed the procedure including the risks, benefits and alternatives for the proposed anesthesia with the patient or authorized representative who has  indicated his/her understanding and acceptance.     Dental advisory given  Plan Discussed with: CRNA  Anesthesia Plan Comments:        Anesthesia Quick Evaluation

## 2020-08-03 NOTE — Transfer of Care (Signed)
Immediate Anesthesia Transfer of Care Note  Patient: Selena Cruz  Procedure(s) Performed: LAPAROSCOPIC PRIMARY INCISIONAL HERNIA REPAIR, LAPAROSCOPIC REPAIR OF INFCISIONAL HERNIA WITH MESH (N/A Abdomen)  Patient Location: PACU  Anesthesia Type:General  Level of Consciousness: awake  Airway & Oxygen Therapy: Patient Spontanous Breathing and Patient connected to face mask oxygen  Post-op Assessment: Report given to RN and Post -op Vital signs reviewed and stable  Post vital signs: Reviewed and stable  Last Vitals:  Vitals Value Taken Time  BP 141/73 08/03/20 1224  Temp    Pulse 87 08/03/20 1227  Resp 17 08/03/20 1227  SpO2 97 % 08/03/20 1227  Vitals shown include unvalidated device data.  Last Pain:  Vitals:   08/03/20 0927  TempSrc: Oral         Complications: No complications documented.

## 2020-08-04 DIAGNOSIS — K430 Incisional hernia with obstruction, without gangrene: Secondary | ICD-10-CM | POA: Diagnosis not present

## 2020-08-04 LAB — BASIC METABOLIC PANEL
Anion gap: 10 (ref 5–15)
BUN: 22 mg/dL (ref 8–23)
CO2: 26 mmol/L (ref 22–32)
Calcium: 8.8 mg/dL — ABNORMAL LOW (ref 8.9–10.3)
Chloride: 101 mmol/L (ref 98–111)
Creatinine, Ser: 1 mg/dL (ref 0.44–1.00)
GFR, Estimated: 58 mL/min — ABNORMAL LOW (ref >60)
Glucose, Bld: 123 mg/dL — ABNORMAL HIGH (ref 70–99)
Potassium: 4.5 mmol/L (ref 3.5–5.1)
Sodium: 137 mmol/L (ref 135–145)

## 2020-08-04 NOTE — Discharge Summary (Signed)
Physician Discharge Summary [facility]  Patient ID: Selena Cruz MRN: [id number] DOB/AGE: June 15, 1946 74 y.o.  Admit date: 08/03/2020 Discharge date: 08/04/2020  Admission Diagnoses:  Laparoscopic Primary repair of incarcerated incisional hernia  & Laparoscopic repair of incisional hernias with mesh Repair Procedure Note; laparoscopic bilateral tap block  Discharge Diagnoses:  Active Problems:   S/P hernia repair   Discharged Condition: good  Hospital Course: Patient was admitted for observation following laparoscopic repair of incisional.  Post op course was uncomplicated.  Pain was well controlled.  Tolerated diet.  Patient was prepared for discharge home on POD#1.  Consults: None  Treatments: surgery: Laparoscopic Primary repair of incarcerated incisional hernia  & Laparoscopic repair of incisional hernias with mesh Repair Procedure Note; laparoscopic bilateral tap block  Discharge Exam: Blood pressure 105/61, pulse 69, temperature 97.9 F (36.6 C), temperature source Oral, resp. rate 17, height 5\' 6"  (1.676 m), weight 85.6 kg, SpO2 97 %. HEENT - clear Neck - soft Chest - clear bilaterally Cor - RRR Abd - wounds dry and intact; binder replaced  Disposition: Home  Discharge Instructions    Diet - low sodium heart healthy   Complete by: As directed    Discharge instructions   Complete by: As directed    [facility]  LAPAROSCOPIC SURGERY:  POST-OP INSTRUCTIONS  Always review your discharge instruction sheet given to you by the facility where your surgery was performed.  A prescription for pain medication may be given to you upon discharge.  Take your pain medication as prescribed.  If narcotic pain medicine is not needed, then you may take acetaminophen (Tylenol) or ibuprofen (Advil) as needed.  Take your usually prescribed medications unless otherwise directed.  If you need a refill on your pain medication, please  contact your pharmacy.  They will contact our office to request authorization. Prescriptions will not be filled after 5 P.M. or on weekends.  You should follow a light diet the first few days after arrival home, such as soup and crackers or toast.  Be sure to include plenty of fluids daily.  Most patients will experience some swelling and bruising in the area of the incisions.  Ice packs will help.  Swelling and bruising can take several days to resolve.   It is common to experience some constipation after surgery.  Increasing fluid intake and taking a stool softener (such as Colace) will usually help or prevent this problem from occurring.  A mild laxative (Milk of Magnesia or Miralax) should be taken according to package instructions if there has been no bowel movement after 48 hours.  You will likely have Dermabond (topical glue) over your incisions.  This seals the incisions and allows you to bathe and shower at any time after your surgery.  Glue should remain in place for up to 10 days.  It may be removed after 10 days by pealing off the Dermabond material or using Vaseline or naval jelly to remove.  If you have steri-strips over your incisions, you may remove the gauze bandage on the second day after surgery, and you may shower at that time.  Leave your steri-strips (small skin tapes) in place directly over the incision.  These strips should remain on the skin for 5-7 days and then be removed.  You may get them wet in the shower and pat them dry.  Any sutures or staples will be removed at the office during your follow-up visit.  ACTIVITIES:  You may resume regular (light) daily activities beginning the next day - such as daily self-care, walking, climbing stairs - gradually increasing activities as tolerated.  You may have sexual intercourse when it is comfortable.  Refrain from any heavy lifting or straining until approved by your doctor.  You may drive when you are no longer taking  prescription pain medication, when you can comfortably wear a seatbelt, and when you can safely maneuver your car and apply brakes.  You should see your doctor in the office for a follow-up appointment approximately 2-3 weeks after your surgery.  Make sure that you call for this appointment within a day or two after you arrive home to insure a convenient appointment time.  WHEN TO CALL YOUR DOCTOR: Fever over 101.0 Inability to urinate Continued bleeding from incision Increased pain, redness, or drainage from the incision Increasing abdominal pain  The clinic staff is available to answer your questions during regular business hours.  Please Cruz't hesitate to call and ask to speak to one of the nurses for clinical concerns.  If you have a medical emergency, go to the nearest emergency room or call 911.  A surgeon from [facility] is always on call for the hospital.  Earnstine Regal, MD, [facility] Office: [place] Free:  [place] [phone]  Website: [email or web address]   Increase activity slowly   Complete by: As directed    No dressing needed   Complete by: As directed      Allergies as of 08/04/2020   No Known Allergies     Medication List    STOP taking these medications   methylPREDNISolone 4 MG Tbpk tablet Commonly known as: MEDROL DOSEPAK   metroNIDAZOLE 500 MG tablet Commonly known as: FLAGYL     TAKE these medications   acetaminophen 500 MG tablet Commonly known as: TYLENOL Take 2 tablets (1,000 mg total) by mouth every 8 (eight) hours for 5 days.   aspirin EC 81 MG tablet Take 81 mg by mouth every evening. Swallow whole.   atorvastatin 40 MG tablet Commonly known as: LIPITOR Take 20 mg by mouth daily.   gabapentin 100 MG capsule Commonly known as: Neurontin Take 1 capsule (100 mg total) by mouth 2 (two) times daily for 5 days.   Micardis 40 MG tablet Generic drug: telmisartan Take  40 mg by mouth daily.   multivitamin with minerals Tabs tablet Take 1 tablet by mouth daily. Centrum Silver   Natural Vitamin D-3 125 MCG (5000 UT) Tabs Generic drug: Cholecalciferol Take 5,000 Units by mouth daily.   omeprazole-sodium bicarbonate 40-1100 MG capsule Commonly known as: ZEGERID Take 1 capsule by mouth daily before breakfast.   oxyCODONE 5 MG immediate release tablet Commonly known as: Oxy IR/ROXICODONE Take 1 tablet (5 mg total) by mouth every 6 (six) hours as needed for breakthrough pain.   pantoprazole 40 MG tablet Commonly known as: PROTONIX Take 40 mg by mouth daily with supper.   Refresh Tears 0.5 % Soln Generic drug: carboxymethylcellulose Place 1 drop into both eyes daily.            Discharge Care Instructions  (From admission, onward)         Start     Ordered   08/04/20 0000  No dressing needed        08/04/20 1660          Follow-up Information    Greer Pickerel, MD. Schedule an appointment as soon  as possible for a visit in 2 week(s).   Specialty: General Surgery Contact information: [address] [place] [address] [phone]               Earnstine Regal, MD, [facility] Office: [phone]   Signed: Armandina Gemma 08/04/2020, 8:24 AM

## 2020-08-06 ENCOUNTER — Encounter (HOSPITAL_COMMUNITY): Payer: Self-pay | Admitting: General Surgery

## 2021-01-07 ENCOUNTER — Other Ambulatory Visit: Payer: Self-pay | Admitting: Internal Medicine

## 2021-01-07 DIAGNOSIS — Z1231 Encounter for screening mammogram for malignant neoplasm of breast: Secondary | ICD-10-CM

## 2021-01-09 ENCOUNTER — Ambulatory Visit
Admission: RE | Admit: 2021-01-09 | Discharge: 2021-01-09 | Disposition: A | Discharge status: Home / Self Care | Payer: Medicare Other | Source: Ambulatory Visit | Attending: Internal Medicine | Admitting: Internal Medicine

## 2021-01-09 ENCOUNTER — Other Ambulatory Visit: Payer: Self-pay

## 2021-01-09 DIAGNOSIS — Z1231 Encounter for screening mammogram for malignant neoplasm of breast: Secondary | ICD-10-CM

## 2021-03-15 ENCOUNTER — Inpatient Hospital Stay: Payer: Medicare Other | Attending: Hematology & Oncology

## 2021-03-15 ENCOUNTER — Inpatient Hospital Stay (HOSPITAL_BASED_OUTPATIENT_CLINIC_OR_DEPARTMENT_OTHER): Payer: Medicare Other | Admitting: Hematology & Oncology

## 2021-03-15 ENCOUNTER — Other Ambulatory Visit: Payer: Self-pay

## 2021-03-15 ENCOUNTER — Encounter: Payer: Self-pay | Admitting: Hematology & Oncology

## 2021-03-15 VITALS — BP 130/92 | HR 70 | Temp 98.4°F | Resp 20 | Wt 177.0 lb

## 2021-03-15 DIAGNOSIS — Z9889 Other specified postprocedural states: Secondary | ICD-10-CM

## 2021-03-15 DIAGNOSIS — Z923 Personal history of irradiation: Secondary | ICD-10-CM | POA: Diagnosis not present

## 2021-03-15 DIAGNOSIS — Z853 Personal history of malignant neoplasm of breast: Secondary | ICD-10-CM | POA: Diagnosis present

## 2021-03-15 DIAGNOSIS — Z9221 Personal history of antineoplastic chemotherapy: Secondary | ICD-10-CM | POA: Insufficient documentation

## 2021-03-15 DIAGNOSIS — Z8719 Personal history of other diseases of the digestive system: Secondary | ICD-10-CM

## 2021-03-15 DIAGNOSIS — C50919 Malignant neoplasm of unspecified site of unspecified female breast: Secondary | ICD-10-CM

## 2021-03-15 LAB — CBC WITH DIFFERENTIAL (CANCER CENTER ONLY)
Abs Immature Granulocytes: 0.02 10*3/uL (ref 0.00–0.07)
Basophils Absolute: 0 10*3/uL (ref 0.0–0.1)
Basophils Relative: 1 %
Eosinophils Absolute: 0.1 10*3/uL (ref 0.0–0.5)
Eosinophils Relative: 1 %
HCT: 41.2 % (ref 36.0–46.0)
Hemoglobin: 13.2 g/dL (ref 12.0–15.0)
Immature Granulocytes: 0 %
Lymphocytes Relative: 21 %
Lymphs Abs: 1.8 10*3/uL (ref 0.7–4.0)
MCH: 28.8 pg (ref 26.0–34.0)
MCHC: 32 g/dL (ref 30.0–36.0)
MCV: 90 fL (ref 80.0–100.0)
Monocytes Absolute: 0.5 10*3/uL (ref 0.1–1.0)
Monocytes Relative: 6 %
Neutro Abs: 6.2 10*3/uL (ref 1.7–7.7)
Neutrophils Relative %: 71 %
Platelet Count: 218 10*3/uL (ref 150–400)
RBC: 4.58 MIL/uL (ref 3.87–5.11)
RDW: 13.8 % (ref 11.5–15.5)
WBC Count: 8.7 10*3/uL (ref 4.0–10.5)
nRBC: 0 % (ref 0.0–0.2)

## 2021-03-15 LAB — CMP (CANCER CENTER ONLY)
ALT: 16 U/L (ref 0–44)
AST: 20 U/L (ref 15–41)
Albumin: 4 g/dL (ref 3.5–5.0)
Alkaline Phosphatase: 118 U/L (ref 38–126)
Anion gap: 5 (ref 5–15)
BUN: 19 mg/dL (ref 8–23)
CO2: 34 mmol/L — ABNORMAL HIGH (ref 22–32)
Calcium: 10 mg/dL (ref 8.9–10.3)
Chloride: 104 mmol/L (ref 98–111)
Creatinine: 0.91 mg/dL (ref 0.44–1.00)
GFR, Estimated: >60 mL/min (ref >60)
Glucose, Bld: 85 mg/dL (ref 70–99)
Potassium: 4.8 mmol/L (ref 3.5–5.1)
Sodium: 143 mmol/L (ref 135–145)
Total Bilirubin: 0.4 mg/dL (ref 0.3–1.2)
Total Protein: 6.8 g/dL (ref 6.5–8.1)

## 2021-03-15 NOTE — Progress Notes (Signed)
Hematology and Oncology Follow Up Visit  Selena Cruz [id number] 08/11/1946 75 y.o. 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  Current Therapy:   Observation.     Interim History:  Ms.  Cruz is is back for followup. We see her yearly.  It has been 23 years since she had treatment.   She is doing quite well.  She really has had no problems since we last saw her.  However, she did have abdominal surgery.  We did do a scan because she had a nodule on her abdomen.  I think she was found to have some hernias.  She went a head and had surgery to correct these.  I think she also had a colitis type infection.  We helped treat that for her.  She currently is feeling well.  She pulled a tick off her right lower leg today.  There is just a little bit of a red mark.  She did put some Neosporin on it.  She has had a good appetite.  She has lost weight.  She is on Ozempic for blood sugars.  This is helped with blood sugar control and also with her weight.  She and her family will be heading off to [facility] for vacation in August.  She had a mammogram recently.  Everything looked fine on the mammogram.  This was done in late March.  Currently, her performance status is ECOG 1.    Allergies: No Known Allergies  Past Medical History, Surgical history, Social history, and Family History were reviewed and updated.  Review of Systems: Review of Systems  Constitutional: Negative.   HENT: Negative.   Eyes: Negative.   Respiratory: Negative.   Cardiovascular: Negative.   Gastrointestinal: Negative.   Genitourinary: Negative.   Musculoskeletal: Negative.   Skin: Negative.   Neurological: Negative.   Endo/Heme/Allergies: Negative.   Psychiatric/Behavioral: Negative.      Physical Exam:  weight is 80.3 kg. Her oral temperature is 98.4 F (36.9 C). Her blood pressure is 130/92 (abnormal) and her pulse is 70.  Her respiration is 20 and oxygen saturation is 95%.   Physical Exam Vitals reviewed.  Constitutional:      Comments: Breast exam shows right breast no masses, edema or erythema.  There is no right axillary adenopathy.  Left breast is slightly contracted from radiation and surgery.  She has well-healed lumpectomy scar at about the 2 o'clock position.  There is no discharge from the nipple.  There is no left axillary adenopathy.  HENT:     Head: Normocephalic and atraumatic.  Eyes:     Pupils: Pupils are equal, round, and reactive to light.  Cardiovascular:     Rate and Rhythm: Normal rate and regular rhythm.     Heart sounds: Normal heart sounds.  Pulmonary:     Effort: Pulmonary effort is normal.     Breath sounds: Normal breath sounds.  Abdominal:     General: Bowel sounds are normal.     Palpations: Abdomen is soft.  Musculoskeletal:        General: No tenderness or deformity. Normal range of motion.     Cervical back: Normal range of motion.  Lymphadenopathy:     Cervical: No cervical adenopathy.  Skin:    General: Skin is warm and dry.     Findings: No erythema or rash.  Neurological:     Mental Status: She is alert  and oriented to person, place, and time.  Psychiatric:        Behavior: Behavior normal.        Thought Content: Thought content normal.        Judgment: Judgment normal.     Lab Results  Component Value Date   WBC 8.7 03/15/2021   HGB 13.2 03/15/2021   HCT 41.2 03/15/2021   MCV 90.0 03/15/2021   PLT 218 03/15/2021     Chemistry      Component Value Date/Time   NA 143 03/15/2021 1138   NA 143 03/02/2017 1011   K 4.8 03/15/2021 1138   K 4.6 03/02/2017 1011   CL 104 03/15/2021 1138   CO2 34 (H) 03/15/2021 1138   CO2 30 (H) 03/02/2017 1011   BUN 19 03/15/2021 1138   BUN 18.0 03/02/2017 1011   CREATININE 0.91 03/15/2021 1138   CREATININE 0.9 03/02/2017 1011      Component Value Date/Time   CALCIUM 10.0 03/15/2021 1138   CALCIUM 9.7 03/02/2017  1011   ALKPHOS 118 03/15/2021 1138   ALKPHOS 132 03/02/2017 1011   AST 20 03/15/2021 1138   AST 27 03/02/2017 1011   ALT 16 03/15/2021 1138   ALT 29 03/02/2017 1011   BILITOT 0.4 03/15/2021 1138   BILITOT 0.52 03/02/2017 1011      Impression and Plan: Selena Cruz is a 75 yr old white female with a history of stage I infiltrating ductal carcinoma the left breast. She was ER negative. She underwent adjuvant chemotherapy with 4 cycles of Cytoxan/Adriamycin. She had radiation.  I am happy that everything is going so well for her.  I really do not think that breast cancer recurrence is going be a problem for her.  We will still see her back yearly.  She feels confident with coming to see [place].  She knows that she will always get a good exam and we check her labs and complement what her family doctor does.     Volanda Napoleon, MD 6/3/20221:16 PM

## 2021-03-16 LAB — CANCER ANTIGEN 27.29: CA 27.29: 17.7 U/mL (ref 0.0–38.6)

## 2021-03-19 ENCOUNTER — Telehealth: Payer: Self-pay

## 2021-03-19 NOTE — Telephone Encounter (Signed)
Called pt and she is aware of her f/u appt per 03/15/21 los   Damione Robideau

## 2021-10-01 ENCOUNTER — Other Ambulatory Visit: Payer: Self-pay | Admitting: Internal Medicine

## 2021-10-01 DIAGNOSIS — I700 Atherosclerosis of aorta: Secondary | ICD-10-CM

## 2021-10-30 ENCOUNTER — Ambulatory Visit
Admission: RE | Admit: 2021-10-30 | Discharge: 2021-10-30 | Disposition: A | Discharge status: Home / Self Care | Payer: No Typology Code available for payment source | Source: Ambulatory Visit | Attending: Internal Medicine | Admitting: Internal Medicine

## 2021-10-30 DIAGNOSIS — I700 Atherosclerosis of aorta: Secondary | ICD-10-CM

## 2021-11-15 ENCOUNTER — Other Ambulatory Visit: Payer: Self-pay | Admitting: Internal Medicine

## 2021-11-15 DIAGNOSIS — R7989 Other specified abnormal findings of blood chemistry: Secondary | ICD-10-CM

## 2021-11-15 DIAGNOSIS — R748 Abnormal levels of other serum enzymes: Secondary | ICD-10-CM

## 2021-11-27 ENCOUNTER — Ambulatory Visit
Admission: RE | Admit: 2021-11-27 | Discharge: 2021-11-27 | Disposition: A | Discharge status: Home / Self Care | Payer: No Typology Code available for payment source | Source: Ambulatory Visit | Attending: Internal Medicine | Admitting: Internal Medicine

## 2021-11-27 ENCOUNTER — Ambulatory Visit
Admission: RE | Admit: 2021-11-27 | Discharge: 2021-11-27 | Disposition: A | Discharge status: Home / Self Care | Payer: Medicare Other | Source: Ambulatory Visit | Attending: Internal Medicine | Admitting: Internal Medicine

## 2021-11-27 DIAGNOSIS — R748 Abnormal levels of other serum enzymes: Secondary | ICD-10-CM

## 2021-11-27 DIAGNOSIS — R7989 Other specified abnormal findings of blood chemistry: Secondary | ICD-10-CM

## 2021-12-30 ENCOUNTER — Other Ambulatory Visit: Payer: Self-pay | Admitting: Internal Medicine

## 2021-12-30 DIAGNOSIS — Z1231 Encounter for screening mammogram for malignant neoplasm of breast: Secondary | ICD-10-CM

## 2022-01-15 ENCOUNTER — Ambulatory Visit
Admission: RE | Admit: 2022-01-15 | Discharge: 2022-01-15 | Disposition: A | Discharge status: Home / Self Care | Payer: Medicare Other | Source: Ambulatory Visit | Attending: Internal Medicine | Admitting: Internal Medicine

## 2022-01-15 DIAGNOSIS — Z1231 Encounter for screening mammogram for malignant neoplasm of breast: Secondary | ICD-10-CM

## 2022-01-15 HISTORY — DX: Personal history of irradiation: Z92.3

## 2022-01-15 HISTORY — DX: Personal history of antineoplastic chemotherapy: Z92.21

## 2022-03-17 ENCOUNTER — Inpatient Hospital Stay: Payer: Medicare Other | Attending: Hematology & Oncology

## 2022-03-17 ENCOUNTER — Other Ambulatory Visit: Payer: Self-pay | Admitting: *Deleted

## 2022-03-17 ENCOUNTER — Encounter: Payer: Self-pay | Admitting: Hematology & Oncology

## 2022-03-17 ENCOUNTER — Inpatient Hospital Stay (HOSPITAL_BASED_OUTPATIENT_CLINIC_OR_DEPARTMENT_OTHER): Payer: Medicare Other | Admitting: Hematology & Oncology

## 2022-03-17 VITALS — BP 132/67 | HR 69 | Temp 98.2°F | Resp 20 | Ht 66.5 in | Wt 173.1 lb

## 2022-03-17 DIAGNOSIS — Z853 Personal history of malignant neoplasm of breast: Secondary | ICD-10-CM | POA: Diagnosis present

## 2022-03-17 DIAGNOSIS — Z8719 Personal history of other diseases of the digestive system: Secondary | ICD-10-CM

## 2022-03-17 DIAGNOSIS — C50011 Malignant neoplasm of nipple and areola, right female breast: Secondary | ICD-10-CM | POA: Diagnosis not present

## 2022-03-17 LAB — CBC WITH DIFFERENTIAL (CANCER CENTER ONLY)
Abs Immature Granulocytes: 0.02 10*3/uL (ref 0.00–0.07)
Basophils Absolute: 0 10*3/uL (ref 0.0–0.1)
Basophils Relative: 1 %
Eosinophils Absolute: 0.1 10*3/uL (ref 0.0–0.5)
Eosinophils Relative: 2 %
HCT: 41.1 % (ref 36.0–46.0)
Hemoglobin: 12.8 g/dL (ref 12.0–15.0)
Immature Granulocytes: 0 %
Lymphocytes Relative: 24 %
Lymphs Abs: 1.7 10*3/uL (ref 0.7–4.0)
MCH: 28.3 pg (ref 26.0–34.0)
MCHC: 31.1 g/dL (ref 30.0–36.0)
MCV: 90.9 fL (ref 80.0–100.0)
Monocytes Absolute: 0.4 10*3/uL (ref 0.1–1.0)
Monocytes Relative: 5 %
Neutro Abs: 4.7 10*3/uL (ref 1.7–7.7)
Neutrophils Relative %: 68 %
Platelet Count: 186 10*3/uL (ref 150–400)
RBC: 4.52 MIL/uL (ref 3.87–5.11)
RDW: 13.3 % (ref 11.5–15.5)
WBC Count: 6.9 10*3/uL (ref 4.0–10.5)
nRBC: 0 % (ref 0.0–0.2)

## 2022-03-17 LAB — CMP (CANCER CENTER ONLY)
ALT: 15 U/L (ref 0–44)
AST: 16 U/L (ref 15–41)
Albumin: 3.8 g/dL (ref 3.5–5.0)
Alkaline Phosphatase: 117 U/L (ref 38–126)
Anion gap: 6 (ref 5–15)
BUN: 20 mg/dL (ref 8–23)
CO2: 31 mmol/L (ref 22–32)
Calcium: 9.5 mg/dL (ref 8.9–10.3)
Chloride: 105 mmol/L (ref 98–111)
Creatinine: 0.91 mg/dL (ref 0.44–1.00)
GFR, Estimated: >60 mL/min (ref >60)
Glucose, Bld: 132 mg/dL — ABNORMAL HIGH (ref 70–99)
Potassium: 4.5 mmol/L (ref 3.5–5.1)
Sodium: 142 mmol/L (ref 135–145)
Total Bilirubin: 0.5 mg/dL (ref 0.3–1.2)
Total Protein: 6.7 g/dL (ref 6.5–8.1)

## 2022-03-17 NOTE — Progress Notes (Signed)
Hematology and Oncology Follow Up Visit  LACREASHA HINDS [id number] 1946/05/30 76 y.o. 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  Current Therapy:   Observation.     Interim History:  Ms.  Kearl is is back for followup. We see her yearly.  It has been 24 years since she had treatment.   She is doing pretty well.  She really has no specific complaints outside of some issues with her left ear.  For about a month, she is had some disturbance in the left ear.  She has had little bit of decreased hearing.  There is no vertigo.  There is no tinnitus.  She had a mammogram a month ago.  She says she can feel a spot in the right breast.  This is just above the areola at about the 12 o'clock position.  On the mammogram, nothing was noted that was abnormal.  There has been no nipple discharge.  She has had no bleeding from the nipple.  She has had no problems with bowels or bladder.  She has had no issues with cough.  She has had no issues with respect to the abdominal surgery that she had.  I think this was about a year or so ago.  She has had no leg swelling.  She has had no rashes.  There is been no cough or shortness of breath.  Overall, I would say performance status is probably ECOG 1.  Allergies:  Allergies  Allergen Reactions   Augmentin [Amoxicillin-Pot Clavulanate] Diarrhea    Past Medical History, Surgical history, Social history, and Family History were reviewed and updated.  Review of Systems: Review of Systems  Constitutional: Negative.   HENT: Negative.    Eyes: Negative.   Respiratory: Negative.    Cardiovascular: Negative.   Gastrointestinal: Negative.   Genitourinary: Negative.   Musculoskeletal: Negative.   Skin: Negative.   Neurological: Negative.   Endo/Heme/Allergies: Negative.   Psychiatric/Behavioral: Negative.      Physical Exam:  height is 5' 6.5" (1.689 m) and weight is 173 lb 1.3 oz  (78.5 kg). Her oral temperature is 98.2 F (36.8 C). Her blood pressure is 132/67 and her pulse is 69. Her respiration is 20 and oxygen saturation is 96%.   Physical Exam Vitals reviewed.  Constitutional:      Comments: Breast exam shows right breast no masses, edema or erythema.  There is no right axillary adenopathy.  Left breast is slightly contracted from radiation and surgery.  She has well-healed lumpectomy scar at about the 2 o'clock position.  There is no discharge from the nipple.  There is no left axillary adenopathy.  HENT:     Head: Normocephalic and atraumatic.  Eyes:     Pupils: Pupils are equal, round, and reactive to light.  Cardiovascular:     Rate and Rhythm: Normal rate and regular rhythm.     Heart sounds: Normal heart sounds.  Pulmonary:     Effort: Pulmonary effort is normal.     Breath sounds: Normal breath sounds.  Abdominal:     General: Bowel sounds are normal.     Palpations: Abdomen is soft.  Musculoskeletal:        General: No tenderness or deformity. Normal range of motion.     Cervical back: Normal range of motion.  Lymphadenopathy:     Cervical: No cervical adenopathy.  Skin:    General: Skin is warm and dry.  Findings: No erythema or rash.  Neurological:     Mental Status: She is alert and oriented to person, place, and time.  Psychiatric:        Behavior: Behavior normal.        Thought Content: Thought content normal.        Judgment: Judgment normal.    Lab Results  Component Value Date   WBC 6.9 03/17/2022   HGB 12.8 03/17/2022   HCT 41.1 03/17/2022   MCV 90.9 03/17/2022   PLT 186 03/17/2022     Chemistry      Component Value Date/Time   NA 142 03/17/2022 0957   NA 143 03/02/2017 1011   K 4.5 03/17/2022 0957   K 4.6 03/02/2017 1011   CL 105 03/17/2022 0957   CO2 31 03/17/2022 0957   CO2 30 (H) 03/02/2017 1011   BUN 20 03/17/2022 0957   BUN 18.0 03/02/2017 1011   CREATININE 0.91 03/17/2022 0957   CREATININE 0.9 03/02/2017  1011      Component Value Date/Time   CALCIUM 9.5 03/17/2022 0957   CALCIUM 9.7 03/02/2017 1011   ALKPHOS 117 03/17/2022 0957   ALKPHOS 132 03/02/2017 1011   AST 16 03/17/2022 0957   AST 27 03/02/2017 1011   ALT 15 03/17/2022 0957   ALT 29 03/02/2017 1011   BILITOT 0.5 03/17/2022 0957   BILITOT 0.52 03/02/2017 1011      Impression and Plan: Ms. Owensby is a 76 yr old white female with a history of stage I infiltrating ductal carcinoma the left breast. She was ER negative. She underwent adjuvant chemotherapy with 4 cycles of Cytoxan/Adriamycin. She had radiation.  I am happy that everything is going so well for her.  I really do not think that breast cancer recurrence is going be a problem for her.  We will still see her back yearly.  She feels confident with coming to see [place].  She knows that she will always get a good exam and we check her labs and complement what her family doctor does.     Volanda Napoleon, MD 6/5/202312:49 PM

## 2022-07-14 ENCOUNTER — Other Ambulatory Visit: Payer: Self-pay | Admitting: Internal Medicine

## 2022-07-14 DIAGNOSIS — R911 Solitary pulmonary nodule: Secondary | ICD-10-CM

## 2022-08-21 ENCOUNTER — Other Ambulatory Visit: Payer: Medicare Other

## 2022-11-03 ENCOUNTER — Ambulatory Visit
Admission: RE | Admit: 2022-11-03 | Discharge: 2022-11-03 | Disposition: A | Discharge status: Home / Self Care | Payer: Medicare Other | Source: Ambulatory Visit | Attending: Internal Medicine | Admitting: Internal Medicine

## 2022-11-03 DIAGNOSIS — R911 Solitary pulmonary nodule: Secondary | ICD-10-CM

## 2022-11-05 ENCOUNTER — Other Ambulatory Visit: Payer: Self-pay | Admitting: Internal Medicine

## 2022-11-05 DIAGNOSIS — E041 Nontoxic single thyroid nodule: Secondary | ICD-10-CM

## 2022-11-18 ENCOUNTER — Ambulatory Visit
Admission: RE | Admit: 2022-11-18 | Discharge: 2022-11-18 | Disposition: A | Discharge status: Home / Self Care | Payer: Medicare Other | Source: Ambulatory Visit | Attending: Internal Medicine | Admitting: Internal Medicine

## 2022-11-18 DIAGNOSIS — E041 Nontoxic single thyroid nodule: Secondary | ICD-10-CM

## 2022-12-02 ENCOUNTER — Other Ambulatory Visit: Payer: Self-pay | Admitting: Nurse Practitioner

## 2022-12-02 DIAGNOSIS — E042 Nontoxic multinodular goiter: Secondary | ICD-10-CM

## 2022-12-04 ENCOUNTER — Other Ambulatory Visit: Payer: Self-pay | Admitting: Internal Medicine

## 2022-12-04 DIAGNOSIS — Z1231 Encounter for screening mammogram for malignant neoplasm of breast: Secondary | ICD-10-CM

## 2022-12-10 ENCOUNTER — Other Ambulatory Visit (HOSPITAL_COMMUNITY)
Admission: RE | Admit: 2022-12-10 | Discharge: 2022-12-10 | Disposition: A | Discharge status: Home / Self Care | Payer: Medicare Other | Source: Ambulatory Visit | Attending: Nurse Practitioner | Admitting: Nurse Practitioner

## 2022-12-10 ENCOUNTER — Ambulatory Visit
Admission: RE | Admit: 2022-12-10 | Discharge: 2022-12-10 | Disposition: A | Discharge status: Home / Self Care | Payer: Medicare Other | Source: Ambulatory Visit | Attending: Nurse Practitioner | Admitting: Nurse Practitioner

## 2022-12-10 DIAGNOSIS — E042 Nontoxic multinodular goiter: Secondary | ICD-10-CM | POA: Insufficient documentation

## 2022-12-12 LAB — CYTOLOGY - NON PAP

## 2023-01-27 ENCOUNTER — Ambulatory Visit
Admission: RE | Admit: 2023-01-27 | Discharge: 2023-01-27 | Disposition: A | Discharge status: Home / Self Care | Payer: Medicare Other | Source: Ambulatory Visit | Attending: Internal Medicine | Admitting: Internal Medicine

## 2023-01-27 DIAGNOSIS — Z1231 Encounter for screening mammogram for malignant neoplasm of breast: Secondary | ICD-10-CM

## 2023-01-27 HISTORY — DX: Malignant neoplasm of unspecified site of unspecified female breast: C50.919

## 2023-03-17 ENCOUNTER — Inpatient Hospital Stay: Payer: Medicare Other | Attending: Hematology & Oncology

## 2023-03-17 ENCOUNTER — Inpatient Hospital Stay: Payer: Medicare Other | Admitting: Hematology & Oncology

## 2023-04-13 ENCOUNTER — Other Ambulatory Visit: Payer: Self-pay | Admitting: General Surgery

## 2023-04-13 DIAGNOSIS — K8020 Calculus of gallbladder without cholecystitis without obstruction: Secondary | ICD-10-CM

## 2023-05-05 ENCOUNTER — Ambulatory Visit
Admission: RE | Admit: 2023-05-05 | Discharge: 2023-05-05 | Disposition: A | Discharge status: Home / Self Care | Payer: Medicare Other | Source: Ambulatory Visit | Attending: General Surgery | Admitting: General Surgery

## 2023-05-05 DIAGNOSIS — K8020 Calculus of gallbladder without cholecystitis without obstruction: Secondary | ICD-10-CM | POA: Insufficient documentation

## 2023-06-17 ENCOUNTER — Other Ambulatory Visit: Payer: Self-pay | Admitting: *Deleted

## 2023-06-17 DIAGNOSIS — C50011 Malignant neoplasm of nipple and areola, right female breast: Secondary | ICD-10-CM

## 2023-06-18 ENCOUNTER — Inpatient Hospital Stay: Payer: Medicare Other

## 2023-06-18 ENCOUNTER — Inpatient Hospital Stay: Payer: Medicare Other | Attending: Hematology & Oncology | Admitting: Hematology & Oncology

## 2023-06-18 ENCOUNTER — Other Ambulatory Visit: Payer: Self-pay

## 2023-06-18 ENCOUNTER — Encounter: Payer: Self-pay | Admitting: Hematology & Oncology

## 2023-06-18 VITALS — BP 141/63 | HR 66 | Temp 97.6°F | Resp 18 | Ht 66.54 in | Wt 166.0 lb

## 2023-06-18 DIAGNOSIS — Z8719 Personal history of other diseases of the digestive system: Secondary | ICD-10-CM

## 2023-06-18 DIAGNOSIS — C50011 Malignant neoplasm of nipple and areola, right female breast: Secondary | ICD-10-CM

## 2023-06-18 DIAGNOSIS — Z853 Personal history of malignant neoplasm of breast: Secondary | ICD-10-CM | POA: Insufficient documentation

## 2023-06-18 DIAGNOSIS — Z9221 Personal history of antineoplastic chemotherapy: Secondary | ICD-10-CM | POA: Diagnosis not present

## 2023-06-18 DIAGNOSIS — Z9889 Other specified postprocedural states: Secondary | ICD-10-CM

## 2023-06-18 DIAGNOSIS — Z923 Personal history of irradiation: Secondary | ICD-10-CM | POA: Insufficient documentation

## 2023-06-18 LAB — COMPREHENSIVE METABOLIC PANEL
ALT: 13 U/L (ref 0–44)
AST: 17 U/L (ref 15–41)
Albumin: 3.8 g/dL (ref 3.5–5.0)
Alkaline Phosphatase: 110 U/L (ref 38–126)
Anion gap: 6 (ref 5–15)
BUN: 17 mg/dL (ref 8–23)
CO2: 32 mmol/L (ref 22–32)
Calcium: 9.3 mg/dL (ref 8.9–10.3)
Chloride: 106 mmol/L (ref 98–111)
Creatinine, Ser: 0.88 mg/dL (ref 0.44–1.00)
GFR, Estimated: >60 mL/min (ref >60)
Glucose, Bld: 111 mg/dL — ABNORMAL HIGH (ref 70–99)
Potassium: 4.1 mmol/L (ref 3.5–5.1)
Sodium: 144 mmol/L (ref 135–145)
Total Bilirubin: 0.5 mg/dL (ref 0.3–1.2)
Total Protein: 6.7 g/dL (ref 6.5–8.1)

## 2023-06-18 LAB — CBC WITH DIFFERENTIAL (CANCER CENTER ONLY)
Abs Immature Granulocytes: 0.03 10*3/uL (ref 0.00–0.07)
Basophils Absolute: 0.1 10*3/uL (ref 0.0–0.1)
Basophils Relative: 1 %
Eosinophils Absolute: 0.1 10*3/uL (ref 0.0–0.5)
Eosinophils Relative: 2 %
HCT: 41.9 % (ref 36.0–46.0)
Hemoglobin: 13.3 g/dL (ref 12.0–15.0)
Immature Granulocytes: 0 %
Lymphocytes Relative: 26 %
Lymphs Abs: 1.7 10*3/uL (ref 0.7–4.0)
MCH: 29.2 pg (ref 26.0–34.0)
MCHC: 31.7 g/dL (ref 30.0–36.0)
MCV: 91.9 fL (ref 80.0–100.0)
Monocytes Absolute: 0.4 10*3/uL (ref 0.1–1.0)
Monocytes Relative: 6 %
Neutro Abs: 4.4 10*3/uL (ref 1.7–7.7)
Neutrophils Relative %: 65 %
Platelet Count: 180 10*3/uL (ref 150–400)
RBC: 4.56 MIL/uL (ref 3.87–5.11)
RDW: 13.3 % (ref 11.5–15.5)
WBC Count: 6.8 10*3/uL (ref 4.0–10.5)
nRBC: 0 % (ref 0.0–0.2)

## 2023-06-18 NOTE — Progress Notes (Signed)
Hematology and Oncology Follow Up Visit  Selena Cruz [id number] [birth year] 77 y.o. 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  Current Therapy:   Observation.     Interim History:  Ms.  Selena Cruz is is back for followup. We see her yearly.  It has been 25 years since she had treatment.   She is doing pretty well.  She really has no specific complaints she has had a good year.  Is been no problems with COVID.  She has had no change in bowel or bladder habits.  There is been no cough or shortness of breath.  She has had no rashes.  There is been no bleeding.  She is still gets her mammograms.  Her mammogram was done on 01/27/2023.  This did not show any problems with respect to her breast.  She has had no headache.  There is been no visual changes.  She has had no problems with vertigo.  Overall, I would say performance status is probably ECOG 1.  .  Allergies:  Allergies  Allergen Reactions   Augmentin [Amoxicillin-Pot Clavulanate] Diarrhea    Past Medical History, Surgical history, Social history, and Family History were reviewed and updated.  Review of Systems: Review of Systems  Constitutional: Negative.   HENT: Negative.    Eyes: Negative.   Respiratory: Negative.    Cardiovascular: Negative.   Gastrointestinal: Negative.   Genitourinary: Negative.   Musculoskeletal: Negative.   Skin: Negative.   Neurological: Negative.   Endo/Heme/Allergies: Negative.   Psychiatric/Behavioral: Negative.       Physical Exam:  height is 5' 6.54" (1.69 m) and weight is 166 lb (75.3 kg). Her oral temperature is 97.6 F (36.4 C). Her blood pressure is 141/63 (abnormal) and her pulse is 66. Her respiration is 18 and oxygen saturation is 97%.   Physical Exam Vitals reviewed.  Constitutional:      Comments: Breast exam shows right breast no masses, edema or erythema.  There is no right axillary adenopathy.  Left breast  is slightly contracted from radiation and surgery.  She has well-healed lumpectomy scar at about the 2 o'clock position.  There is no discharge from the nipple.  There is no left axillary adenopathy.  HENT:     Head: Normocephalic and atraumatic.  Eyes:     Pupils: Pupils are equal, round, and reactive to light.  Cardiovascular:     Rate and Rhythm: Normal rate and regular rhythm.     Heart sounds: Normal heart sounds.  Pulmonary:     Effort: Pulmonary effort is normal.     Breath sounds: Normal breath sounds.  Abdominal:     General: Bowel sounds are normal.     Palpations: Abdomen is soft.  Musculoskeletal:        General: No tenderness or deformity. Normal range of motion.     Cervical back: Normal range of motion.  Lymphadenopathy:     Cervical: No cervical adenopathy.  Skin:    General: Skin is warm and dry.     Findings: No erythema or rash.  Neurological:     Mental Status: She is alert and oriented to person, place, and time.  Psychiatric:        Behavior: Behavior normal.        Thought Content: Thought content normal.        Judgment: Judgment normal.     Lab Results  Component Value Date  WBC 6.8 06/18/2023   HGB 13.3 06/18/2023   HCT 41.9 06/18/2023   MCV 91.9 06/18/2023   PLT 180 06/18/2023     Chemistry      Component Value Date/Time   NA 144 06/18/2023 0755   NA 143 03/02/2017 1011   K 4.1 06/18/2023 0755   K 4.6 03/02/2017 1011   CL 106 06/18/2023 0755   CO2 32 06/18/2023 0755   CO2 30 (H) 03/02/2017 1011   BUN 17 06/18/2023 0755   BUN 18.0 03/02/2017 1011   CREATININE 0.88 06/18/2023 0755   CREATININE 0.91 03/17/2022 0957   CREATININE 0.9 03/02/2017 1011      Component Value Date/Time   CALCIUM 9.3 06/18/2023 0755   CALCIUM 9.7 03/02/2017 1011   ALKPHOS 110 06/18/2023 0755   ALKPHOS 132 03/02/2017 1011   AST 17 06/18/2023 0755   AST 16 03/17/2022 0957   AST 27 03/02/2017 1011   ALT 13 06/18/2023 0755   ALT 15 03/17/2022 0957   ALT 29  03/02/2017 1011   BILITOT 0.5 06/18/2023 0755   BILITOT 0.5 03/17/2022 0957   BILITOT 0.52 03/02/2017 1011      Impression and Plan: Ms. Selena Cruz is a 77 yr old white female with a history of stage I infiltrating ductal carcinoma the left breast. She was ER negative. She underwent adjuvant chemotherapy with 4 cycles of Cytoxan/Adriamycin. She had radiation.  I am happy that everything is going so well for her.  I really do not think that breast cancer recurrence is going be a problem for her.  It is already been 25 years.  She likes to come back to see [place].  She feels confident with our care.  We will still see her back yearly.     Selena Macho, MD 9/5/20248:33 AM

## 2023-06-19 ENCOUNTER — Other Ambulatory Visit: Payer: Self-pay | Admitting: Medical Genetics

## 2023-06-19 DIAGNOSIS — Z006 Encounter for examination for normal comparison and control in clinical research program: Secondary | ICD-10-CM

## 2023-06-22 ENCOUNTER — Other Ambulatory Visit
Admission: RE | Admit: 2023-06-22 | Discharge: 2023-06-22 | Disposition: A | Discharge status: Home / Self Care | Payer: Medicare Other | Source: Ambulatory Visit | Attending: Medical Genetics | Admitting: Medical Genetics

## 2023-06-22 DIAGNOSIS — Z006 Encounter for examination for normal comparison and control in clinical research program: Secondary | ICD-10-CM | POA: Insufficient documentation

## 2023-09-17 ENCOUNTER — Other Ambulatory Visit: Payer: Self-pay | Admitting: Internal Medicine

## 2023-09-17 DIAGNOSIS — R911 Solitary pulmonary nodule: Secondary | ICD-10-CM

## 2023-09-29 ENCOUNTER — Other Ambulatory Visit: Payer: Self-pay | Admitting: Internal Medicine

## 2023-09-29 DIAGNOSIS — R911 Solitary pulmonary nodule: Secondary | ICD-10-CM

## 2023-11-03 ENCOUNTER — Ambulatory Visit
Admission: RE | Admit: 2023-11-03 | Discharge: 2023-11-03 | Disposition: A | Discharge status: Home / Self Care | Payer: Medicare Other | Source: Ambulatory Visit | Attending: Internal Medicine | Admitting: Internal Medicine

## 2023-11-03 DIAGNOSIS — R911 Solitary pulmonary nodule: Secondary | ICD-10-CM

## 2023-11-30 ENCOUNTER — Other Ambulatory Visit: Payer: Self-pay | Admitting: Nurse Practitioner

## 2023-11-30 DIAGNOSIS — E042 Nontoxic multinodular goiter: Secondary | ICD-10-CM

## 2023-12-04 ENCOUNTER — Other Ambulatory Visit: Payer: Medicare Other

## 2023-12-08 ENCOUNTER — Ambulatory Visit
Admission: RE | Admit: 2023-12-08 | Discharge: 2023-12-08 | Disposition: A | Discharge status: Home / Self Care | Payer: Medicare Other | Source: Ambulatory Visit | Attending: Nurse Practitioner

## 2023-12-08 DIAGNOSIS — E042 Nontoxic multinodular goiter: Secondary | ICD-10-CM

## 2024-01-07 ENCOUNTER — Other Ambulatory Visit: Payer: Self-pay | Admitting: Obstetrics & Gynecology

## 2024-01-19 ENCOUNTER — Encounter (HOSPITAL_COMMUNITY): Payer: Self-pay | Admitting: Obstetrics & Gynecology

## 2024-01-19 NOTE — Progress Notes (Signed)
 Spoke w/ via phone for pre-op interview--- Selena Cruz Lab needs dos----   CBC, T&S and BMP per Careers adviser. EKG per anesthesia.      Lab results------ COVID test -----patient states asymptomatic no test needed Arrive at -------0950 NPO after MN NO Solid Food.  Clear liquids from MN until---0850 Pre-Surgery Ensure or G2:  Med rec completed Medications to take morning of surgery -----Micardis Diabetic medication -----  GLP1 agonist last dose: GLP1 instructions:  Patient instructed no nail polish to be worn day of surgery Patient instructed to bring photo id and insurance card day of surgery Patient aware to have Driver (ride ) / caregiver    for 24 hours after surgery - [name] Patient Special Instructions ----- Shower with antibacterial soap. Pre-Op special Instructions -----  Patient verbalized understanding of instructions that were given at this phone interview. Patient denies chest pain, sob, fever, cough at the interview.

## 2024-01-27 ENCOUNTER — Encounter: Payer: Self-pay | Admitting: Emergency Medicine

## 2024-01-27 ENCOUNTER — Ambulatory Visit: Admitting: Emergency Medicine

## 2024-01-27 VITALS — BP 138/82 | HR 78 | Ht 67.0 in | Wt 165.6 lb

## 2024-01-27 DIAGNOSIS — Z853 Personal history of malignant neoplasm of breast: Secondary | ICD-10-CM

## 2024-01-27 DIAGNOSIS — R918 Other nonspecific abnormal finding of lung field: Secondary | ICD-10-CM | POA: Diagnosis not present

## 2024-01-27 DIAGNOSIS — Z87891 Personal history of nicotine dependence: Secondary | ICD-10-CM

## 2024-01-27 DIAGNOSIS — Z9221 Personal history of antineoplastic chemotherapy: Secondary | ICD-10-CM

## 2024-01-27 DIAGNOSIS — C50912 Malignant neoplasm of unspecified site of left female breast: Secondary | ICD-10-CM

## 2024-01-27 DIAGNOSIS — C50919 Malignant neoplasm of unspecified site of unspecified female breast: Secondary | ICD-10-CM | POA: Insufficient documentation

## 2024-01-27 NOTE — Progress Notes (Signed)
 Subjective:    Patient ID: Selena Cruz, female    DOB: 10-Apr-1946, 78 y.o.   MRN: [id number]  HPI Selena RYE "Lynden Ang" is an 78 year old female with a history of breast cancer and tobacco use who presents to review an abnormal CT scan of the chest. She was referred by Dr. Renne Crigler for evaluation of pulmonary nodules found on a CT scan.  An abnormal CT scan of the chest initially revealed a subsolid ground glass right upper lobe pulmonary nodule during a cardiac calcium scoring CT on October 30, 2021. Subsequent CT scans, including one on November 03, 2022, showed a 1.2 by 1.5 cm apical right upper lobe ground glass nodule, a 4 mm solid apical right upper lobe nodule, and a speculated appearing posterior segment right upper lobe ground glass nodule measuring 1.5 by 2.5 cm. Additional ground glass nodules were noted in the left upper and lower lobes, with the largest being 1.3 by 2.1 cm in the left upper lobe. Serial follow-up was recommended.  The most recent CT scan on November 03, 2023, showed overall stability of the right apical ground glass nodule and the right upper lobe 4 mm nodule, with a slight decrease in size of the irregular ground glass right upper lobe opacity. The left lower lobe ground glass opacity remained stable at 1.4 cm, and the left upper lobe ground glass opacity measured 1.8 by 1.3 cm, showing minimal change compared to previous scans.  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 She has a significant smoking history of 50 pack years but has no family history of lung cancer, although there is a history of emphysema in the family.  No symptoms suggestive of an infectious or inflammatory process. She has not been exposed to occupational hazards such as toxins, fumes, asbestos, or mold, and has no significant exposure to animals or birds.   RADIOLOGY CT chest  (10/30/2021): Subsolid ground glass right upper lobe pulmonary nodule. CT chest (11/03/2022): Centrilobular emphysema; 1.2 x 1.5 cm apical right upper lobe ground glass nodule; 4 mm solid apical right upper lobe nodule; Spiculated appearing posterior segment right upper lobe ground glass nodule 1.5 x 2.5 cm; Additional ground glass nodules in the left upper and left lower lobes; 1.3 x 2.1 cm in the left upper lobe. CT chest (11/03/2023): Overall stability of the right apical ground glass nodule; Stable right upper lobe 4 mm nodule; Slight decrease in size of the irregular ground glass right upper lobe opacity; Stable 1.4 cm left lower lobe ground glass opacity; 1.8 x 1.3 cm ground glass opacity in the left upper lobe minimally changed.   Review of Systems As per HPI  Past Medical History:  Diagnosis Date   Breast cancer (HCC)    Cancer (HCC)    breast CA   Family history of adverse reaction to anesthesia    post-op nausea and vomiting   GERD (gastroesophageal reflux disease)    Hiatal hernia    Hypertension    Personal history of chemotherapy    Personal history of radiation therapy    PONV (postoperative nausea and vomiting)      Family History  Problem Relation Age of Onset   Heart attack Mother    Heart failure Father    Breast cancer Sister    Heart failure Brother    CAD Brother    Breast cancer Maternal  Aunt     - No family history of lung cancer  Social History   Socioeconomic History   Marital status: Married    Spouse name: Not on file   Number of children: Not on file   Years of education: Not on file   Highest education level: Not on file  Occupational History   Not on file  Tobacco Use   Smoking status: Former    Current packs/day: 0.00    Average packs/day: 1 pack/day for 50.0 years (50.0 ttl pk-yrs)    Types: Cigarettes    Start date: 09/21/1959    Quit date: 09/21/2009    Years since quitting: 14.3   Smokeless tobacco: Never   Tobacco comments:     quit 4 years ago  Vaping Use   Vaping status: Never Used  Substance and Sexual Activity   Alcohol use: No    Alcohol/week: 0.0 standard drinks of alcohol   Drug use: No   Sexual activity: Not on file  Other Topics Concern   Not on file  Social History Narrative   Not on file   Social Drivers of Health   Financial Resource Strain: Not on file  Food Insecurity: Not on file  Transportation Needs: Not on file  Physical Activity: Not on file  Stress: Not on file  Social Connections: Not on file  Intimate Partner Violence: Not on file    - Patient has a history of tobacco use (50 pack years)  Allergies  Allergen Reactions   Augmentin [Amoxicillin-Pot Clavulanate] Diarrhea     Outpatient Medications Prior to Visit  Medication Sig Dispense Refill   aspirin EC 81 MG tablet Take 81 mg by mouth every evening. Swallow whole.     atorvastatin (LIPITOR) 40 MG tablet Take 40 mg by mouth daily.     carboxymethylcellulose (REFRESH PLUS) 0.5 % SOLN Place 1 drop into both eyes daily.     Cholecalciferol (NATURAL VITAMIN D-3) 125 MCG (5000 UT) TABS Take 5,000 Units by mouth daily.     docusate sodium (COLACE) 100 MG capsule 100 mg daily as needed.     Multiple Vitamin (MULTIVITAMIN WITH MINERALS) TABS tablet Take 1 tablet by mouth daily. Centrum Silver     pantoprazole (PROTONIX) 40 MG tablet Take 40 mg by mouth daily with supper.      telmisartan (MICARDIS) 40 MG tablet Take 40 mg by mouth daily.     celecoxib (CELEBREX) 200 MG capsule Take 200 mg by mouth 3 (three) times daily. (Patient not taking: Reported on 01/27/2024)     No facility-administered medications prior to visit.         Objective:   Physical Exam Vitals:   01/27/24 1522  BP: 138/82  Pulse: 78  SpO2: 94%  Weight: 165 lb 9.6 oz (75.1 kg)  Height: 5\' 7"  (1.702 m)   Gen: Pleasant, well-nourished, in no distress,  normal affect  ENT: No lesions,  mouth clear,  oropharynx clear, no postnasal drip  Neck: No JVD, no  stridor  Lungs: No use of accessory muscles, no crackles or wheezing on normal respiration, no wheeze on forced expiration  Cardiovascular: RRR, heart sounds normal, no murmur or gallops, no peripheral edema  Musculoskeletal: No deformities, no cyanosis or clubbing  Neuro: alert, awake, non focal  Skin: Warm, no lesions or rash        Assessment & Plan:   Pulmonary nodules Pulmonary nodules Multiple ground glass nodules in right upper, left upper, and left lower  lobes. No significant change in size or appearance, low to moderate suspicion for adenocarcinoma. Asymptomatic with low risk of clinical significance or metastasis. Smoking history is a risk factor. - Order CT chest in January 2026 to monitor nodules. - Review CT results to assess for changes. - Consider bronchoscopy if nodules change significantly.    Breast cancer (HCC) Infiltrating ductal carcinoma of the left breast Stage I treated successfully with adjuvant chemoradiation over 25 years ago. Current nodules not related to previous breast cancer.  History of tobacco use Tobacco use Significant tobacco use with 50 pack-year history, risk factor for pulmonary nodules and potential lung cancer. -No current dyspnea or evidence for COPD although she does have emphysematous change on her CT chest.  No clear indication for PFTs or BD therapy at this time.   Racheal Buddle, MD, PhD 01/27/2024, 4:09 PM [facility] [phone] or if no answer before 7:00PM call [phone] For any issues after 7:00PM please call eLink [phone]

## 2024-01-27 NOTE — Assessment & Plan Note (Signed)
 Pulmonary nodules Multiple ground glass nodules in right upper, left upper, and left lower lobes. No significant change in size or appearance, low to moderate suspicion for adenocarcinoma. Asymptomatic with low risk of clinical significance or metastasis. Smoking history is a risk factor. - Order CT chest in January 2026 to monitor nodules. - Review CT results to assess for changes. - Consider bronchoscopy if nodules change significantly.

## 2024-01-27 NOTE — H&P (Signed)
 Selena Cruz is an 78 y.o. female.  Prolapse getting worse. last seen in Sept'22 w/ new pessary then, not working now, noting some prolapse around the pessary and needs to reduce it. She removes and cleans pessary q 2-3 months. Hx of hysterectomy. Has large rectocele.Aaron Aas   PCP Dr Schuyler Custard HLD, HTN, GERD, Breast cancer.. PreDM  Sees PCP/ Oncologist/ dentist/ eye doc and getting mammogr annually   Pertinent Gynecological History: Menses: {menses:16152} Bleeding: {uterine bleeding:32112} Contraception: {contraception:5051} DES exposure: {denies/unknown:32108} Blood transfusions: {none:33079} Sexually transmitted diseases: {std risk:32110} Previous GYN Procedures: {previous procedures:3041388}  Last mammogram: {normal/abnormal***:32111} Date: *** Last pap: {normal/abnormal***:32111} Date: *** OB History: G***, P***   Menstrual History: Menarche age: *** No LMP recorded. Patient has had a hysterectomy.    Past Medical History:  Diagnosis Date   Breast cancer (HCC)    Cancer (HCC)    breast CA   Family history of adverse reaction to anesthesia    post-op nausea and vomiting   GERD (gastroesophageal reflux disease)    Hiatal hernia    Hypertension    Personal history of chemotherapy    Personal history of radiation therapy    PONV (postoperative nausea and vomiting)     Past Surgical History:  Procedure Laterality Date   ABDOMINAL HYSTERECTOMY     BRAVO PH STUDY N/A 01/31/2013   Procedure: BRAVO PH STUDY;  Surgeon: Evangeline Hilts, MD;  Location: [facility];  Service: Endoscopy;  Laterality: N/A;   BREAST EXCISIONAL BIOPSY Right 1991   COLONOSCOPY     ESOPHAGOGASTRODUODENOSCOPY N/A 01/31/2013   Procedure: ESOPHAGOGASTRODUODENOSCOPY (EGD);  Surgeon: Evangeline Hilts, MD;  Location: [facility];  Service: Endoscopy;  Laterality: N/A;   EXCISION / BIOPSY BREAST / NIPPLE / DUCT Left 1999   KNEE ARTHROSCOPY W/ MENISCAL REPAIR Right 2019   MASTECTOMY     partial on left    ROTATOR CUFF REPAIR     VENTRAL HERNIA REPAIR N/A 08/03/2020   Procedure: LAPAROSCOPIC PRIMARY INCISIONAL HERNIA REPAIR, LAPAROSCOPIC REPAIR OF INFCISIONAL HERNIA WITH MESH;  Surgeon: Aldean Hummingbird, MD;  Location: [facility];  Service: General;  Laterality: N/A;    Family History  Problem Relation Age of Onset   Heart attack Mother    Heart failure Father    Breast cancer Sister    Heart failure Brother    CAD Brother    Breast cancer Maternal Aunt     Social History:  reports that she quit smoking about 14 years ago. Her smoking use included cigarettes. She started smoking about 64 years ago. She has a 50 pack-year smoking history. She has never used smokeless tobacco. She reports that she does not drink alcohol and does not use drugs.  Allergies:  Allergies  Allergen Reactions   Augmentin [Amoxicillin-Pot Clavulanate] Diarrhea    No medications prior to admission.    Review of Systems  Height 5\' 7"  (1.702 m), weight 72.6 kg. Physical Exam  No results found for this or any previous visit (from the past 24 hours).  No results found.  Assessment/Plan: Selena Cruz 01/27/2024, 8:28 PM

## 2024-01-27 NOTE — Assessment & Plan Note (Signed)
 Tobacco use Significant tobacco use with 50 pack-year history, risk factor for pulmonary nodules and potential lung cancer. -No current dyspnea or evidence for COPD although she does have emphysematous change on her CT chest.  No clear indication for PFTs or BD therapy at this time.

## 2024-01-27 NOTE — Assessment & Plan Note (Signed)
 Infiltrating ductal carcinoma of the left breast Stage I treated successfully with adjuvant chemoradiation over 25 years ago. Current nodules not related to previous breast cancer.

## 2024-01-27 NOTE — Patient Instructions (Addendum)
 VISIT SUMMARY:  Today, we reviewed your recent CT scan results and discussed the findings of multiple pulmonary nodules. We also talked about your history of breast cancer and tobacco use.  YOUR PLAN:  -PULMONARY NODULES: Pulmonary nodules are small growths in the lungs. Your CT scan showed multiple ground glass nodules in the right upper, left upper, and left lower lobes. These nodules have not changed significantly in size or appearance, which suggests a low to moderate risk for lung cancer. We will monitor these nodules with another CT scan in January 2026. If there are significant changes, we may consider a bronchoscopy to examine the nodules more closely.   INSTRUCTIONS:  Please schedule a follow-up CT scan for January 2026 to monitor the pulmonary nodules. We will review the results to assess for any changes. If you notice any new symptoms or have concerns, please contact our office.

## 2024-01-28 ENCOUNTER — Ambulatory Visit (HOSPITAL_COMMUNITY)

## 2024-01-28 ENCOUNTER — Encounter (HOSPITAL_COMMUNITY): Payer: Self-pay | Admitting: Obstetrics & Gynecology

## 2024-01-28 ENCOUNTER — Ambulatory Visit (HOSPITAL_BASED_OUTPATIENT_CLINIC_OR_DEPARTMENT_OTHER)

## 2024-01-28 ENCOUNTER — Encounter (HOSPITAL_COMMUNITY)
Admission: RE | Disposition: A | Discharge status: Home / Self Care | Payer: Self-pay | Source: Home / Self Care | Attending: Obstetrics & Gynecology

## 2024-01-28 ENCOUNTER — Ambulatory Visit (HOSPITAL_COMMUNITY)
Admission: RE | Admit: 2024-01-28 | Discharge: 2024-01-28 | Disposition: A | Discharge status: Home / Self Care | Attending: Obstetrics & Gynecology | Admitting: Obstetrics & Gynecology

## 2024-01-28 ENCOUNTER — Other Ambulatory Visit: Payer: Self-pay

## 2024-01-28 DIAGNOSIS — I10 Essential (primary) hypertension: Secondary | ICD-10-CM | POA: Diagnosis not present

## 2024-01-28 DIAGNOSIS — Z79899 Other long term (current) drug therapy: Secondary | ICD-10-CM | POA: Insufficient documentation

## 2024-01-28 DIAGNOSIS — Z87891 Personal history of nicotine dependence: Secondary | ICD-10-CM | POA: Insufficient documentation

## 2024-01-28 DIAGNOSIS — Z853 Personal history of malignant neoplasm of breast: Secondary | ICD-10-CM | POA: Diagnosis not present

## 2024-01-28 DIAGNOSIS — R7303 Prediabetes: Secondary | ICD-10-CM | POA: Diagnosis not present

## 2024-01-28 DIAGNOSIS — K449 Diaphragmatic hernia without obstruction or gangrene: Secondary | ICD-10-CM | POA: Insufficient documentation

## 2024-01-28 DIAGNOSIS — N816 Rectocele: Secondary | ICD-10-CM

## 2024-01-28 DIAGNOSIS — Z90710 Acquired absence of both cervix and uterus: Secondary | ICD-10-CM | POA: Insufficient documentation

## 2024-01-28 DIAGNOSIS — K219 Gastro-esophageal reflux disease without esophagitis: Secondary | ICD-10-CM | POA: Diagnosis not present

## 2024-01-28 DIAGNOSIS — E785 Hyperlipidemia, unspecified: Secondary | ICD-10-CM | POA: Diagnosis not present

## 2024-01-28 HISTORY — PX: RECTOCELE REPAIR: SHX761

## 2024-01-28 HISTORY — DX: Rectocele: N81.6

## 2024-01-28 LAB — BASIC METABOLIC PANEL WITH GFR
Anion gap: 7 (ref 5–15)
BUN: 13 mg/dL (ref 8–23)
CO2: 27 mmol/L (ref 22–32)
Calcium: 8.8 mg/dL — ABNORMAL LOW (ref 8.9–10.3)
Chloride: 107 mmol/L (ref 98–111)
Creatinine, Ser: 0.76 mg/dL (ref 0.44–1.00)
GFR, Estimated: >60 mL/min (ref >60)
Glucose, Bld: 99 mg/dL (ref 70–99)
Potassium: 3.5 mmol/L (ref 3.5–5.1)
Sodium: 141 mmol/L (ref 135–145)

## 2024-01-28 LAB — TYPE AND SCREEN
ABO/RH(D): O POS
Antibody Screen: NEGATIVE

## 2024-01-28 LAB — CBC
HCT: 40.1 % (ref 36.0–46.0)
Hemoglobin: 13 g/dL (ref 12.0–15.0)
MCH: 29.1 pg (ref 26.0–34.0)
MCHC: 32.4 g/dL (ref 30.0–36.0)
MCV: 89.7 fL (ref 80.0–100.0)
Platelets: 181 10*3/uL (ref 150–400)
RBC: 4.47 MIL/uL (ref 3.87–5.11)
RDW: 12.9 % (ref 11.5–15.5)
WBC: 6.6 10*3/uL (ref 4.0–10.5)
nRBC: 0 % (ref 0.0–0.2)

## 2024-01-28 LAB — ABO/RH: ABO/RH(D): O POS

## 2024-01-28 SURGERY — COLPORRHAPHY, POSTERIOR, FOR RECTOCELE REPAIR
Anesthesia: General

## 2024-01-28 MED ORDER — PHENYLEPHRINE HCL-NACL 20-0.9 MG/250ML-% IV SOLN
INTRAVENOUS | Status: DC | PRN
  Administered 2024-01-28: 20 ug/min via INTRAVENOUS

## 2024-01-28 MED ORDER — PROPOFOL 10 MG/ML IV BOLUS
INTRAVENOUS | Status: DC | PRN
  Administered 2024-01-28: 120 mg via INTRAVENOUS

## 2024-01-28 MED ORDER — CHLORHEXIDINE GLUCONATE 0.12 % MT SOLN
15.0000 mL | Freq: Once | OROMUCOSAL | Status: AC
  Administered 2024-01-28: 15 mL via OROMUCOSAL

## 2024-01-28 MED ORDER — OXYCODONE HCL 5 MG PO TABS: 5.0000 mg | ORAL_TABLET | Freq: Four times a day (QID) | ORAL | 0 refills | Status: AC | PRN

## 2024-01-28 MED ORDER — PHENYLEPHRINE 80 MCG/ML (10ML) SYRINGE FOR IV PUSH (FOR BLOOD PRESSURE SUPPORT)
PREFILLED_SYRINGE | INTRAVENOUS | Status: DC | PRN
  Administered 2024-01-28: 80 ug via INTRAVENOUS

## 2024-01-28 MED ORDER — HYDROMORPHONE HCL 1 MG/ML IJ SOLN: 0.2500 mg | INTRAMUSCULAR | Status: DC | PRN

## 2024-01-28 MED ORDER — SUGAMMADEX SODIUM 200 MG/2ML IV SOLN
INTRAVENOUS | Status: DC | PRN
  Administered 2024-01-28: 200 mg via INTRAVENOUS

## 2024-01-28 MED ORDER — AMISULPRIDE (ANTIEMETIC) 5 MG/2ML IV SOLN
10.0000 mg | Freq: Once | INTRAVENOUS | Status: AC
  Administered 2024-01-28: 10 mg via INTRAVENOUS

## 2024-01-28 MED ORDER — VASOPRESSIN 20 UNIT/ML IV SOLN
INTRAVENOUS | Status: DC | PRN
  Administered 2024-01-28: 13 mL via SURGICAL_CAVITY

## 2024-01-28 MED ORDER — PROPOFOL 10 MG/ML IV BOLUS
INTRAVENOUS | Status: AC
  Filled 2024-01-28: qty 20

## 2024-01-28 MED ORDER — OXYCODONE HCL 5 MG/5ML PO SOLN: 5.0000 mg | Freq: Once | ORAL | Status: DC | PRN

## 2024-01-28 MED ORDER — SODIUM CHLORIDE 0.9 % IV SOLN
12.5000 mg | INTRAVENOUS | Status: DC | PRN
  Administered 2024-01-28: 12.5 mg via INTRAVENOUS
  Filled 2024-01-28: qty 0.5

## 2024-01-28 MED ORDER — DEXAMETHASONE SODIUM PHOSPHATE 10 MG/ML IJ SOLN
INTRAMUSCULAR | Status: AC
  Filled 2024-01-28: qty 1

## 2024-01-28 MED ORDER — CLINDAMYCIN PHOSPHATE 900 MG/50ML IV SOLN
900.0000 mg | INTRAVENOUS | Status: AC
  Administered 2024-01-28: 900 mg via INTRAVENOUS

## 2024-01-28 MED ORDER — SODIUM CHLORIDE (PF) 0.9 % IJ SOLN
INTRAMUSCULAR | Status: AC
  Filled 2024-01-28: qty 100

## 2024-01-28 MED ORDER — FENTANYL CITRATE (PF) 250 MCG/5ML IJ SOLN
INTRAMUSCULAR | Status: DC | PRN
Start: 2024-01-28 — End: 2024-01-28
  Administered 2024-01-28 (×2): 50 ug via INTRAVENOUS

## 2024-01-28 MED ORDER — ACETAMINOPHEN 500 MG PO TABS: 500.0000 mg | ORAL_TABLET | Freq: Four times a day (QID) | ORAL | 0 refills | Status: DC | PRN

## 2024-01-28 MED ORDER — LIDOCAINE 2% (20 MG/ML) 5 ML SYRINGE
INTRAMUSCULAR | Status: AC
  Filled 2024-01-28: qty 5

## 2024-01-28 MED ORDER — POVIDONE-IODINE 10 % EX SWAB: 2.0000 | Freq: Once | CUTANEOUS | Status: DC

## 2024-01-28 MED ORDER — ORAL CARE MOUTH RINSE: 15.0000 mL | Freq: Once | OROMUCOSAL | Status: AC

## 2024-01-28 MED ORDER — LIDOCAINE 2% (20 MG/ML) 5 ML SYRINGE
INTRAMUSCULAR | Status: DC | PRN
Start: 2024-01-28 — End: 2024-01-28
  Administered 2024-01-28: 80 mg via INTRAVENOUS

## 2024-01-28 MED ORDER — GENTAMICIN SULFATE 40 MG/ML IJ SOLN
5.0000 mg/kg | INTRAVENOUS | Status: AC
  Administered 2024-01-28: 330 mg via INTRAVENOUS
  Filled 2024-01-28: qty 8.25

## 2024-01-28 MED ORDER — BUPIVACAINE HCL (PF) 0.25 % IJ SOLN
INTRAMUSCULAR | Status: AC
  Filled 2024-01-28: qty 30

## 2024-01-28 MED ORDER — DEXAMETHASONE SODIUM PHOSPHATE 10 MG/ML IJ SOLN
INTRAMUSCULAR | Status: DC | PRN
Start: 2024-01-28 — End: 2024-01-28
  Administered 2024-01-28: 10 mg via INTRAVENOUS

## 2024-01-28 MED ORDER — IBUPROFEN 800 MG PO TABS
800.0000 mg | ORAL_TABLET | Freq: Once | ORAL | Status: AC
  Administered 2024-01-28: 800 mg via ORAL

## 2024-01-28 MED ORDER — ESTRADIOL 0.1 MG/GM VA CREA
TOPICAL_CREAM | VAGINAL | Status: AC
  Filled 2024-01-28: qty 42.5

## 2024-01-28 MED ORDER — ROCURONIUM BROMIDE 10 MG/ML (PF) SYRINGE
PREFILLED_SYRINGE | INTRAVENOUS | Status: AC
Start: 2024-01-28 — End: ?
  Filled 2024-01-28: qty 10

## 2024-01-28 MED ORDER — BUPIVACAINE-EPINEPHRINE (PF) 0.25% -1:200000 IJ SOLN
INTRAMUSCULAR | Status: DC | PRN
  Administered 2024-01-28: 10 mL via PERINEURAL

## 2024-01-28 MED ORDER — MIDAZOLAM HCL 2 MG/2ML IJ SOLN
INTRAMUSCULAR | Status: AC
  Filled 2024-01-28: qty 2

## 2024-01-28 MED ORDER — MIDAZOLAM HCL 2 MG/2ML IJ SOLN
INTRAMUSCULAR | Status: DC | PRN
  Administered 2024-01-28: 1 mg via INTRAVENOUS

## 2024-01-28 MED ORDER — LACTATED RINGERS IV SOLN: INTRAVENOUS | Status: DC

## 2024-01-28 MED ORDER — FENTANYL CITRATE (PF) 250 MCG/5ML IJ SOLN
INTRAMUSCULAR | Status: AC
  Filled 2024-01-28: qty 5

## 2024-01-28 MED ORDER — PHENYLEPHRINE 80 MCG/ML (10ML) SYRINGE FOR IV PUSH (FOR BLOOD PRESSURE SUPPORT)
PREFILLED_SYRINGE | INTRAVENOUS | Status: AC
  Filled 2024-01-28: qty 10

## 2024-01-28 MED ORDER — OXYCODONE HCL 5 MG PO TABS: 5.0000 mg | ORAL_TABLET | Freq: Once | ORAL | Status: DC | PRN

## 2024-01-28 MED ORDER — CHLORHEXIDINE GLUCONATE 0.12 % MT SOLN
OROMUCOSAL | Status: AC
  Filled 2024-01-28: qty 15

## 2024-01-28 MED ORDER — AMISULPRIDE (ANTIEMETIC) 5 MG/2ML IV SOLN
INTRAVENOUS | Status: AC
  Filled 2024-01-28: qty 4

## 2024-01-28 MED ORDER — CLINDAMYCIN PHOSPHATE 900 MG/50ML IV SOLN
INTRAVENOUS | Status: AC
  Filled 2024-01-28: qty 50

## 2024-01-28 MED ORDER — IBUPROFEN 200 MG PO TABS: 600.0000 mg | ORAL_TABLET | Freq: Three times a day (TID) | ORAL | 0 refills | Status: DC | PRN

## 2024-01-28 MED ORDER — ONDANSETRON HCL 4 MG/2ML IJ SOLN
INTRAMUSCULAR | Status: AC
  Filled 2024-01-28: qty 2

## 2024-01-28 MED ORDER — IBUPROFEN 800 MG PO TABS
ORAL_TABLET | ORAL | Status: AC
  Filled 2024-01-28: qty 1

## 2024-01-28 MED ORDER — ROCURONIUM BROMIDE 10 MG/ML (PF) SYRINGE
PREFILLED_SYRINGE | INTRAVENOUS | Status: DC | PRN
  Administered 2024-01-28: 70 mg via INTRAVENOUS

## 2024-01-28 MED ORDER — ONDANSETRON HCL 4 MG/2ML IJ SOLN
INTRAMUSCULAR | Status: DC | PRN
  Administered 2024-01-28: 4 mg via INTRAVENOUS

## 2024-01-28 MED ORDER — VASOPRESSIN 20 UNIT/ML IV SOLN
INTRAVENOUS | Status: AC
  Filled 2024-01-28: qty 1

## 2024-01-28 SURGICAL SUPPLY — 20 items
BLADE SURG 15 STRL LF DISP TIS (BLADE) ×2 IMPLANT
CANISTER SUCT 3000ML PPV (MISCELLANEOUS) ×2 IMPLANT
DRAPE STERI URO 9X17 APER PCH (DRAPES) ×2 IMPLANT
GAUZE PACKING 1 X5 YD ST (GAUZE/BANDAGES/DRESSINGS) IMPLANT
GAUZE PACKING 1INX5YD STRL (GAUZE/BANDAGES/DRESSINGS) IMPLANT
GLOVE BIO SURGEON STRL SZ7 (GLOVE) ×2 IMPLANT
GLOVE BIOGEL PI IND STRL 7.0 (GLOVE) ×4 IMPLANT
GOWN STRL REUS W/ TWL LRG LVL3 (GOWN DISPOSABLE) ×8 IMPLANT
KIT TURNOVER KIT B (KITS) ×2 IMPLANT
NDL HYPO 22X1.5 SAFETY MO (MISCELLANEOUS) ×2 IMPLANT
NEEDLE HYPO 22X1.5 SAFETY MO (MISCELLANEOUS) ×1 IMPLANT
NS IRRIG 1000ML POUR BTL (IV SOLUTION) ×2 IMPLANT
PACK VAGINAL WOMENS (CUSTOM PROCEDURE TRAY) ×2 IMPLANT
RETRACTOR STAY HOOK 5MM (MISCELLANEOUS) IMPLANT
SPIKE FLUID TRANSFER (MISCELLANEOUS) IMPLANT
SUT VIC AB 0 CT1 27XBRD ANBCTR (SUTURE) IMPLANT
SUT VIC AB 2-0 SH 27XBRD (SUTURE) IMPLANT
SUT VIC AB 3-0 SH 27X BRD (SUTURE) IMPLANT
TOWEL GREEN STERILE FF (TOWEL DISPOSABLE) ×4 IMPLANT
TRAY FOLEY W/BAG SLVR 14FR (SET/KITS/TRAYS/PACK) ×2 IMPLANT

## 2024-01-28 NOTE — Discharge Instructions (Signed)

## 2024-01-28 NOTE — Anesthesia Procedure Notes (Signed)
 Procedure Name: Intubation Date/Time: 01/28/2024 10:50 AM  Performed by: Ballard Levels, CRNAPre-anesthesia Checklist: Patient identified, Emergency Drugs available, Suction available and Patient being monitored Patient Re-evaluated:Patient Re-evaluated prior to induction Oxygen Delivery Method: Circle System Utilized Preoxygenation: Pre-oxygenation with 100% oxygen Induction Type: IV induction and Cricoid Pressure applied Ventilation: Mask ventilation without difficulty Laryngoscope Size: Miller and 3 Grade View: Grade I Tube type: Oral Number of attempts: 1 Airway Equipment and Method: Stylet Placement Confirmation: ETT inserted through vocal cords under direct vision, positive ETCO2 and breath sounds checked- equal and bilateral Secured at: 21 cm Tube secured with: Tape Dental Injury: Teeth and Oropharynx as per pre-operative assessment

## 2024-01-28 NOTE — Progress Notes (Signed)
 Called patient to come in at 0830. Agreeable to coming in at 0830

## 2024-01-28 NOTE — Transfer of Care (Signed)
 Immediate Anesthesia Transfer of Care Note  Patient: Selena Cruz  Procedure(s) Performed: COLPORRHAPHY, POSTERIOR, FOR RECTOCELE REPAIR  Patient Location: PACU  Anesthesia Type:General  Level of Consciousness: awake, alert , oriented, and patient cooperative  Airway & Oxygen Therapy: Patient Spontanous Breathing and Patient connected to nasal cannula oxygen  Post-op Assessment: Report given to RN and Post -op Vital signs reviewed and stable  Post vital signs: Reviewed and stable  Last Vitals:  Vitals Value Taken Time  BP 138/78 01/28/24 1223  Temp    Pulse 76 01/28/24 1224  Resp 19 01/28/24 1224  SpO2 98 % 01/28/24 1224  Vitals shown include unfiled device data.  Last Pain:  Vitals:   01/28/24 0838  TempSrc: Oral  PainSc: 0-No pain      Patients Stated Pain Goal: 5 (01/28/24 1610)  Complications: No notable events documented.

## 2024-01-28 NOTE — Op Note (Signed)
 Lalita Ebel December 12, 1945  PRE-OPERATIVE DIAGNOSIS:  Grade III Rectocele    POST-OPERATIVE DIAGNOSIS:  Grade III Rectocele    PROCEDURE: POSTERIOR REPAIR (RECTOCELE)/ Posterior Colporrhaphy and perineorrhaphy   SURGEON:  [name], MD - Primary  ASSISTANTS: none  ANESTHESIA:  General  LOCAL MEDICATIONS USED:    IV FLUIDS:  LR 700 cc  EBL:  30 cc  Urine Output:  clear in foley 300 cc  SPECIMEN: None  DISPOSITION OF SPECIMEN:  PACU  COUNTS:  YES   INDICATION: Large symptomatic rectocele (grade 3).   PROCEDURE:  Informed written consent obtained after reviewing risks/ complications. She was brought to the Operating Room with IV running. She received Clindamycin and Gentamicin per protocol. She underwent general anesthesia without complications. She was given dorsal lithotomy position.   Exam under anesthesia noted grade III rectocele and a gaping perineum.   Dilute 0.25% Marcaine with Vasopressin used for infiltration. 2 Allis Clamps applied to outer edge of perineum below the hymen to mark outermost edge for perineorrhaphy. Infiltation fluid injected. Inverted triangle incision made at the posterior fourchette. Allis clamps placed on outer edges of vaginal cut and a central Allis placed over vaginal mucosa covering the Rectocele. Rectovaginal exam performed and gloves changed. Infiltration done along the midline over the Rectocele under vaginal mucosa. With Strulley scissors vaginal mucosa was carefully undermined and incised in midline. Allis clamps were applied as we moved up towards the highest point of rectocele close to vaginal vault. Now vaginal cut edges on both sides were grasped and with blunt and sharp dissection the endopelvic fascia was dissected away from vaginal mucosa. Bleeding cauterized. A large defect in the fascia noted on the right. Endopelvic fascia was approximated over Rectocele hernia pouch with 2-0 Vicryl in interrupted fashion while reducing  rectocele. Excess vaginal skin excised. Vaginal skin approximated in midline with 3-0 Vicryl.  Posterior perineorrhaphy done with a crown stitch to create good perineal body with 0-Vicryl, then submucosa approximated, followed by perineal skin with 2-0 Vicryl. Hemostasis excellent.  Lubricant applied followed by vaginal packing. Packing and foley to be removed in PACU in 1 hour.   All counts correct, procedure completed without complications. Patient stable, brought to PACU and will be admitted overnight for observation with vaginal packing.  I performed the entire procedure.   Terri Fester, MD

## 2024-01-28 NOTE — Anesthesia Preprocedure Evaluation (Signed)
 Anesthesia Evaluation  Patient identified by MRN, date of birth, ID band Patient awake    Reviewed: Allergy & Precautions, NPO status , Patient's Chart, lab work & pertinent test results  History of Anesthesia Complications (+) PONV and history of anesthetic complications  Airway Mallampati: II  TM Distance: >3 FB Neck ROM: Full    Dental no notable dental hx. (+) Teeth Intact, Dental Advisory Given,    Pulmonary former smoker Quit smoking 2010, 50 pack year history    Pulmonary exam normal breath sounds clear to auscultation       Cardiovascular hypertension, Pt. on medications Normal cardiovascular exam Rhythm:Regular Rate:Normal     Neuro/Psych negative neurological ROS  negative psych ROS   GI/Hepatic Neg liver ROS, hiatal hernia,GERD  Medicated and Controlled,,Umbilical, ventral hernia    Endo/Other  negative endocrine ROS  hct 42, plt 218  Renal/GU negative Renal ROS  negative genitourinary   Musculoskeletal negative musculoskeletal ROS (+)    Abdominal   Peds  Hematology negative hematology ROS (+)   Anesthesia Other Findings Hx breast ca   Reproductive/Obstetrics negative OB ROS                             Anesthesia Physical Anesthesia Plan  ASA: II  Anesthesia Plan: General   Post-op Pain Management:    Induction: Intravenous  PONV Risk Score and Plan: 4 or greater and Ondansetron, Dexamethasone, Midazolam, Treatment may vary due to age or medical condition and Droperidol  Airway Management Planned: Oral ETT  Additional Equipment: None  Intra-op Plan:   Post-operative Plan: Extubation in OR  Informed Consent: I have reviewed the patients History and Physical, chart, labs and discussed the procedure including the risks, benefits and alternatives for the proposed anesthesia with the patient or authorized representative who has indicated his/her understanding and  acceptance.     Dental advisory given  Plan Discussed with: CRNA  Anesthesia Plan Comments:         Anesthesia Quick Evaluation

## 2024-01-28 NOTE — Progress Notes (Signed)
 Next dose of Ibuprofen written on discharge instructions as 12:00 am, and patient and spouse instructed.

## 2024-01-29 ENCOUNTER — Encounter (HOSPITAL_COMMUNITY): Payer: Self-pay | Admitting: Obstetrics & Gynecology

## 2024-01-29 NOTE — Anesthesia Postprocedure Evaluation (Signed)
 Anesthesia Post Note  Patient: PASSION LAVIN  Procedure(s) Performed: COLPORRHAPHY, POSTERIOR, FOR RECTOCELE REPAIR     Patient location during evaluation: PACU Anesthesia Type: General Level of consciousness: awake and alert Pain management: pain level controlled Vital Signs Assessment: post-procedure vital signs reviewed and stable Respiratory status: spontaneous breathing, nonlabored ventilation and respiratory function stable Cardiovascular status: blood pressure returned to baseline and stable Postop Assessment: no apparent nausea or vomiting Anesthetic complications: no   No notable events documented.  Last Vitals:  Vitals:   01/28/24 1530 01/28/24 1545  BP: 111/60   Pulse: 79 82  Resp:    Temp:    SpO2: 90% 91%    Last Pain:  Vitals:   01/28/24 1615  TempSrc:   PainSc: 2    Pain Goal: Patients Stated Pain Goal: 5 (01/28/24 1615)                 Earvin Goldberg

## 2024-02-05 ENCOUNTER — Inpatient Hospital Stay (HOSPITAL_COMMUNITY): Admitting: Certified Registered Nurse Anesthetist

## 2024-02-05 ENCOUNTER — Inpatient Hospital Stay (HOSPITAL_BASED_OUTPATIENT_CLINIC_OR_DEPARTMENT_OTHER): Admitting: Certified Registered Nurse Anesthetist

## 2024-02-05 ENCOUNTER — Observation Stay (HOSPITAL_COMMUNITY)
Admission: RE | Admit: 2024-02-05 | Discharge: 2024-02-07 | Disposition: A | Discharge status: Home / Self Care | Attending: Obstetrics | Admitting: Obstetrics

## 2024-02-05 ENCOUNTER — Other Ambulatory Visit: Payer: Self-pay

## 2024-02-05 ENCOUNTER — Encounter (HOSPITAL_COMMUNITY): Payer: Self-pay | Admitting: Obstetrics & Gynecology

## 2024-02-05 ENCOUNTER — Encounter (HOSPITAL_COMMUNITY)
Admission: RE | Disposition: A | Discharge status: Home / Self Care | Payer: Self-pay | Source: Home / Self Care | Attending: Obstetrics & Gynecology

## 2024-02-05 DIAGNOSIS — Z01818 Encounter for other preprocedural examination: Principal | ICD-10-CM

## 2024-02-05 DIAGNOSIS — N939 Abnormal uterine and vaginal bleeding, unspecified: Secondary | ICD-10-CM | POA: Diagnosis present

## 2024-02-05 DIAGNOSIS — I10 Essential (primary) hypertension: Secondary | ICD-10-CM | POA: Insufficient documentation

## 2024-02-05 DIAGNOSIS — Z7982 Long term (current) use of aspirin: Secondary | ICD-10-CM | POA: Insufficient documentation

## 2024-02-05 DIAGNOSIS — Z79899 Other long term (current) drug therapy: Secondary | ICD-10-CM | POA: Insufficient documentation

## 2024-02-05 DIAGNOSIS — Z87891 Personal history of nicotine dependence: Secondary | ICD-10-CM | POA: Insufficient documentation

## 2024-02-05 DIAGNOSIS — N816 Rectocele: Secondary | ICD-10-CM | POA: Insufficient documentation

## 2024-02-05 DIAGNOSIS — N938 Other specified abnormal uterine and vaginal bleeding: Principal | ICD-10-CM | POA: Insufficient documentation

## 2024-02-05 DIAGNOSIS — Z853 Personal history of malignant neoplasm of breast: Secondary | ICD-10-CM | POA: Insufficient documentation

## 2024-02-05 HISTORY — PX: EXAM UNDER ANESTHESIA, PELVIC: SHX7461

## 2024-02-05 SURGERY — EXAM UNDER ANESTHESIA, PELVIC
Anesthesia: Monitor Anesthesia Care

## 2024-02-05 MED ORDER — 0.9 % SODIUM CHLORIDE (POUR BTL) OPTIME
TOPICAL | Status: DC | PRN
  Administered 2024-02-05: 1000 mL

## 2024-02-05 MED ORDER — CEFAZOLIN SODIUM-DEXTROSE 2-3 GM-%(50ML) IV SOLR
INTRAVENOUS | Status: DC | PRN
  Administered 2024-02-05: 2 g via INTRAVENOUS

## 2024-02-05 MED ORDER — CEFAZOLIN SODIUM-DEXTROSE 2-4 GM/100ML-% IV SOLN
INTRAVENOUS | Status: AC
  Filled 2024-02-05: qty 100

## 2024-02-05 MED ORDER — PANTOPRAZOLE SODIUM 40 MG PO TBEC
40.0000 mg | DELAYED_RELEASE_TABLET | Freq: Every day | ORAL | Status: DC
  Administered 2024-02-05 – 2024-02-06 (×2): 40 mg via ORAL
  Filled 2024-02-05: qty 1

## 2024-02-05 MED ORDER — POLYVINYL ALCOHOL 1.4 % OP SOLN
1.0000 [drp] | Freq: Every day | OPHTHALMIC | Status: DC
  Administered 2024-02-07: 1 [drp] via OPHTHALMIC
  Filled 2024-02-05 (×2): qty 15

## 2024-02-05 MED ORDER — ATORVASTATIN CALCIUM 40 MG PO TABS
40.0000 mg | ORAL_TABLET | Freq: Every day | ORAL | Status: DC
  Administered 2024-02-06 – 2024-02-07 (×2): 40 mg via ORAL
  Filled 2024-02-05 (×2): qty 1

## 2024-02-05 MED ORDER — LACTATED RINGERS IV SOLN: INTRAVENOUS | Status: DC

## 2024-02-05 MED ORDER — FENTANYL CITRATE (PF) 100 MCG/2ML IJ SOLN
25.0000 ug | INTRAMUSCULAR | Status: DC | PRN
  Administered 2024-02-05: 25 ug via INTRAVENOUS

## 2024-02-05 MED ORDER — PROPOFOL 10 MG/ML IV BOLUS
INTRAVENOUS | Status: AC
  Filled 2024-02-05: qty 20

## 2024-02-05 MED ORDER — PANTOPRAZOLE SODIUM 40 MG PO TBEC
DELAYED_RELEASE_TABLET | ORAL | Status: AC
  Filled 2024-02-05: qty 1

## 2024-02-05 MED ORDER — ACETAMINOPHEN 500 MG PO TABS: 500.0000 mg | ORAL_TABLET | Freq: Four times a day (QID) | ORAL | Status: DC | PRN

## 2024-02-05 MED ORDER — FENTANYL CITRATE (PF) 100 MCG/2ML IJ SOLN
INTRAMUSCULAR | Status: AC
Start: 2024-02-05 — End: 2024-02-06
  Filled 2024-02-05: qty 2

## 2024-02-05 MED ORDER — PROPOFOL 10 MG/ML IV BOLUS
INTRAVENOUS | Status: DC | PRN
Start: 2024-02-05 — End: 2024-02-05
  Administered 2024-02-05: 150 ug/kg/min via INTRAVENOUS

## 2024-02-05 MED ORDER — CHLORHEXIDINE GLUCONATE 0.12 % MT SOLN
15.0000 mL | Freq: Once | OROMUCOSAL | Status: AC
  Administered 2024-02-05: 15 mL via OROMUCOSAL

## 2024-02-05 MED ORDER — ACETAMINOPHEN 10 MG/ML IV SOLN: 1000.0000 mg | Freq: Once | INTRAVENOUS | Status: DC | PRN

## 2024-02-05 MED ORDER — ORAL CARE MOUTH RINSE: 15.0000 mL | Freq: Once | OROMUCOSAL | Status: AC

## 2024-02-05 MED ORDER — DOCUSATE SODIUM 100 MG PO CAPS: 100.0000 mg | ORAL_CAPSULE | Freq: Every day | ORAL | Status: DC | PRN

## 2024-02-05 SURGICAL SUPPLY — 19 items
FILTER UTR ASPR ASSEMBLY (MISCELLANEOUS) ×2 IMPLANT
GAUZE PACKING FOLDED 2 STR (GAUZE/BANDAGES/DRESSINGS) IMPLANT
GLOVE BIO SURGEON STRL SZ7 (GLOVE) ×2 IMPLANT
GLOVE BIOGEL PI IND STRL 7.0 (GLOVE) ×4 IMPLANT
GOWN STRL REUS W/ TWL LRG LVL3 (GOWN DISPOSABLE) ×4 IMPLANT
HOSE CONNECTING 18IN BERKELEY (TUBING) ×2 IMPLANT
KIT BERKELEY 1ST TRI 3/8 NO TR (MISCELLANEOUS) ×2 IMPLANT
KIT BERKELEY 1ST TRIMESTER 3/8 (MISCELLANEOUS) ×2 IMPLANT
NS IRRIG 1000ML POUR BTL (IV SOLUTION) ×2 IMPLANT
PACK VAGINAL MINOR WOMEN LF (CUSTOM PROCEDURE TRAY) ×2 IMPLANT
PAD OB MATERNITY 11 LF (PERSONAL CARE ITEMS) ×2 IMPLANT
SET BERKELEY SUCTION TUBING (SUCTIONS) ×2 IMPLANT
SPIKE FLUID TRANSFER (MISCELLANEOUS) ×2 IMPLANT
TOWEL GREEN STERILE FF (TOWEL DISPOSABLE) ×4 IMPLANT
UNDERPAD 30X36 HEAVY ABSORB (UNDERPADS AND DIAPERS) ×2 IMPLANT
VACURETTE 10 RIGID CVD (CANNULA) IMPLANT
VACURETTE 7MM CVD STRL WRAP (CANNULA) IMPLANT
VACURETTE 8 RIGID CVD (CANNULA) IMPLANT
VACURETTE 9 RIGID CVD (CANNULA) IMPLANT

## 2024-02-05 NOTE — Anesthesia Preprocedure Evaluation (Addendum)
 Anesthesia Evaluation  Patient identified by MRN, date of birth, ID band Patient awake    Reviewed: Allergy & Precautions, NPO status , Patient's Chart, lab work & pertinent test results  History of Anesthesia Complications (+) PONV and history of anesthetic complications  Airway Mallampati: II  TM Distance: >3 FB Neck ROM: Full    Dental no notable dental hx.    Pulmonary former smoker   Pulmonary exam normal        Cardiovascular hypertension, Pt. on medications  Rhythm:Regular Rate:Normal     Neuro/Psych negative neurological ROS  negative psych ROS   GI/Hepatic Neg liver ROS, hiatal hernia,GERD  ,,  Endo/Other  negative endocrine ROS    Renal/GU negative Renal ROS  Female GU complaint     Musculoskeletal negative musculoskeletal ROS (+)    Abdominal Normal abdominal exam  (+)   Peds  Hematology Lab Results      Component                Value               Date                      WBC                      6.6                 01/28/2024                HGB                      13.0                01/28/2024                HCT                      40.1                01/28/2024                MCV                      89.7                01/28/2024                PLT                      181                 01/28/2024              Anesthesia Other Findings   Reproductive/Obstetrics                             Anesthesia Physical Anesthesia Plan  ASA: 2  Anesthesia Plan: MAC   Post-op Pain Management:    Induction: Intravenous  PONV Risk Score and Plan: 4 or greater and Treatment may vary due to age or medical condition, Ondansetron , Dexamethasone  and Propofol  infusion  Airway Management Planned: Simple Face Mask and Nasal Cannula  Additional Equipment: None  Intra-op Plan:   Post-operative Plan:   Informed Consent: I have reviewed the patients History and Physical,  chart, labs and discussed the  procedure including the risks, benefits and alternatives for the proposed anesthesia with the patient or authorized representative who has indicated his/her understanding and acceptance.     Dental advisory given  Plan Discussed with: CRNA  Anesthesia Plan Comments:        Anesthesia Quick Evaluation

## 2024-02-05 NOTE — Op Note (Signed)
 02/05/24 Operative note   Pre op- Post op rectocele vaginal bleeding  Post op - same, dehiscence of rectocele repair Procedure: exam under anesthesia and repair of rectocele dehiscence Surgeon Terri Fester MD  Assist  None IVF 500 cc LR  Urine 300 cc EBL 10 cc  Drains Foley in place and Vaginal pack in place   Procedure: Patient had rectocele repair on 01/28/24 and had been having vaginal bleeding since post op day 1.  Patient time out confirmed.  She underwent MAC anesthesia. 2 gm Ancef  given. Parts prepped and draped and foley inserted. Exam under anesthesia noted 3 cm dehiscence of rectocele repair 1 cm from apex and also dehiscence of perineal stitches. Rectovaginal exam noted intact rectum. GLoves changed.  Vaginal was washed with warm saline. 3-0 vicryl running sutures placed to close of dehiscence defect and perineal repair defect. Bleeding was well controlled after clots removed and saline wash done. Vaginal packing inserted after applying gel. Foley was left in place. Hemostasis noted. All counts correct.  Patient brought to PACU and will be admitted for observation.   I performed this surgery. Terri Fester MD

## 2024-02-05 NOTE — H&P (Signed)
 Selena Cruz is an 78 y.o. female here for exam under anesthesia and repair of bleeding from recent rectocele surgery . Pt c/o bleeding since day after surgery. She had vaginal packing on 4/23, removed this morning and had several clots and odor.  She denies rectal bleeding or voiding discomfort     Past Medical History:  Diagnosis Date   Breast cancer (HCC)    Cancer (HCC)    breast CA   Family history of adverse reaction to anesthesia    post-op nausea and vomiting   GERD (gastroesophageal reflux disease)    Hiatal hernia    Hypertension    Personal history of chemotherapy    Personal history of radiation therapy    PONV (postoperative nausea and vomiting)    Rectocele 01/28/2024    Past Surgical History:  Procedure Laterality Date   ABDOMINAL HYSTERECTOMY     BRAVO PH STUDY N/A 01/31/2013   Procedure: BRAVO PH STUDY;  Surgeon: Evangeline Hilts, MD;  Location: [facility];  Service: Endoscopy;  Laterality: N/A;   BREAST EXCISIONAL BIOPSY Right 1991   COLONOSCOPY     ESOPHAGOGASTRODUODENOSCOPY N/A 01/31/2013   Procedure: ESOPHAGOGASTRODUODENOSCOPY (EGD);  Surgeon: Evangeline Hilts, MD;  Location: [facility];  Service: Endoscopy;  Laterality: N/A;   EXCISION / BIOPSY BREAST / NIPPLE / DUCT Left 1999   KNEE ARTHROSCOPY W/ MENISCAL REPAIR Right 2019   MASTECTOMY     partial on left   RECTOCELE REPAIR N/A 01/28/2024   Procedure: COLPORRHAPHY, POSTERIOR, FOR RECTOCELE REPAIR;  Surgeon: Terri Fester, MD;  Location: [facility] OR;  Service: Gynecology;  Laterality: N/A;   ROTATOR CUFF REPAIR     VENTRAL HERNIA REPAIR N/A 08/03/2020   Procedure: LAPAROSCOPIC PRIMARY INCISIONAL HERNIA REPAIR, LAPAROSCOPIC REPAIR OF INFCISIONAL HERNIA WITH MESH;  Surgeon: Aldean Hummingbird, MD;  Location: [facility]S;  Service: General;  Laterality: N/A;    Family History  Problem Relation Age of Onset   Heart attack Mother    Heart failure Father    Breast cancer Sister    Heart failure Brother    CAD  Brother    Breast cancer Maternal Aunt     Social History:  reports that she quit smoking about 14 years ago. Her smoking use included cigarettes. She started smoking about 64 years ago. She has a 50 pack-year smoking history. She has never used smokeless tobacco. She reports that she does not drink alcohol  and does not use drugs.  Allergies:  Allergies  Allergen Reactions   Augmentin [Amoxicillin-Pot Clavulanate] Diarrhea    Medications Prior to Admission  Medication Sig Dispense Refill Last Dose/Taking   acetaminophen  (TYLENOL ) 500 MG tablet Take 1 tablet (500 mg total) by mouth every 6 (six) hours as needed. 30 tablet 0 02/05/2024   aspirin EC 81 MG tablet Take 81 mg by mouth every evening. Swallow whole.   02/04/2024   atorvastatin  (LIPITOR) 40 MG tablet Take 40 mg by mouth daily.   02/05/2024   carboxymethylcellulose (REFRESH PLUS) 0.5 % SOLN Place 1 drop into both eyes daily.   Past Week   Cholecalciferol (NATURAL VITAMIN D -3) 125 MCG (5000 UT) TABS Take 5,000 Units by mouth daily.   02/04/2024   docusate sodium  (COLACE) 100 MG capsule 100 mg daily as needed.   Past Week   ibuprofen  (ADVIL ) 200 MG tablet Take 3 tablets (600 mg total) by mouth every 8 (eight) hours as needed. 30 tablet 0 Past Week   Multiple Vitamin (MULTIVITAMIN WITH MINERALS) TABS tablet  Take 1 tablet by mouth daily. Centrum Silver   02/04/2024   pantoprazole  (PROTONIX ) 40 MG tablet Take 40 mg by mouth daily with supper.    02/05/2024   telmisartan (MICARDIS) 40 MG tablet Take 40 mg by mouth daily.   02/05/2024   celecoxib (CELEBREX) 200 MG capsule Take 200 mg by mouth 3 (three) times daily. (Patient not taking: Reported on 01/27/2024)       Blood pressure (!) 155/83, pulse 85, temperature (!) 97.5 F (36.4 C), temperature source Oral, resp. rate 17, height 5' 6.5" (1.689 m), weight 73.5 kg, SpO2 94%. Physical Exam  A&O x 3, no acute distress. Pleasant HEENT neg, no thyromegaly Lungs CTA bilat CV RRR, S1S2  normal Abdo soft, non tender, non acute Extr no edema/ tenderness Pelvic Suture line intact bleeding noted but cannot asses source    Assessment/Plan: 78 yo female s/p rectocele repair, here for reassessment of ongoing vaginal bleeding.  Plan exam under anesthesia and repair as needed, possible packing and admission overnight   [name] 02/05/2024, 5:50 PM

## 2024-02-05 NOTE — Anesthesia Procedure Notes (Signed)
 Date/Time: 02/05/2024 7:11 PM  Performed by: Jakyle Petrucelli M, CRNAOxygen Delivery Method: Simple face mask

## 2024-02-05 NOTE — Transfer of Care (Signed)
 Immediate Anesthesia Transfer of Care Note  Patient: Selena Cruz  Procedure(s) Performed: Laurita Porta UNDER ANESTHESIA, PELVIC  Patient Location: PACU  Anesthesia Type:MAC  Level of Consciousness: awake, alert , and oriented  Airway & Oxygen Therapy: Patient Spontanous Breathing and Patient connected to nasal cannula oxygen  Post-op Assessment: Report given to RN, Post -op Vital signs reviewed and stable, and Patient moving all extremities X 4  Post vital signs: Reviewed and stable  Last Vitals:  Vitals Value Taken Time  BP    Temp    Pulse 72 02/05/24 1952  Resp 14 02/05/24 1952  SpO2 98 % 02/05/24 1952  Vitals shown include unfiled device data.  Last Pain:  Vitals:   02/05/24 1611  TempSrc: Oral  PainSc: 0-No pain         Complications: No notable events documented.

## 2024-02-05 NOTE — Progress Notes (Signed)
 Patient takes 40mg  PO telmisartan daily in the am for HTN.  This medication was not reordered.  Please address on am rounds.  Currently, patient resting comfortably in PACU.  VSS, HR 82 NSR, bp 129/69, and sO2 97% on room air.  Will continue to monitor.

## 2024-02-06 DIAGNOSIS — N938 Other specified abnormal uterine and vaginal bleeding: Secondary | ICD-10-CM | POA: Diagnosis not present

## 2024-02-06 LAB — CBC
HCT: 32.6 % — ABNORMAL LOW (ref 36.0–46.0)
Hemoglobin: 10.7 g/dL — ABNORMAL LOW (ref 12.0–15.0)
MCH: 29.4 pg (ref 26.0–34.0)
MCHC: 32.8 g/dL (ref 30.0–36.0)
MCV: 89.6 fL (ref 80.0–100.0)
Platelets: 223 10*3/uL (ref 150–400)
RBC: 3.64 MIL/uL — ABNORMAL LOW (ref 3.87–5.11)
RDW: 13.1 % (ref 11.5–15.5)
WBC: 8.4 10*3/uL (ref 4.0–10.5)
nRBC: 0 % (ref 0.0–0.2)

## 2024-02-06 MED ORDER — ONDANSETRON HCL 4 MG/2ML IJ SOLN
4.0000 mg | Freq: Four times a day (QID) | INTRAMUSCULAR | Status: DC | PRN
  Filled 2024-02-06: qty 2

## 2024-02-06 MED ORDER — ONDANSETRON HCL 4 MG PO TABS
4.0000 mg | ORAL_TABLET | Freq: Four times a day (QID) | ORAL | Status: DC | PRN
  Administered 2024-02-06: 4 mg via ORAL
  Filled 2024-02-06: qty 1

## 2024-02-06 MED ORDER — ACETAMINOPHEN 325 MG PO TABS: 650.0000 mg | ORAL_TABLET | ORAL | Status: DC | PRN

## 2024-02-06 MED ORDER — ORAL CARE MOUTH RINSE: 15.0000 mL | OROMUCOSAL | Status: DC | PRN

## 2024-02-06 MED ORDER — SENNOSIDES-DOCUSATE SODIUM 8.6-50 MG PO TABS
1.0000 | ORAL_TABLET | Freq: Every day | ORAL | Status: DC
  Filled 2024-02-06: qty 1

## 2024-02-06 MED ORDER — MENTHOL 3 MG MT LOZG: 1.0000 | LOZENGE | OROMUCOSAL | Status: DC | PRN

## 2024-02-06 MED ORDER — CHLORHEXIDINE GLUCONATE CLOTH 2 % EX PADS
6.0000 | MEDICATED_PAD | Freq: Every day | CUTANEOUS | Status: DC
  Administered 2024-02-06: 6 via TOPICAL

## 2024-02-06 NOTE — Plan of Care (Signed)
 Pt alert and oriented x4. Sitting in bed. Spouse visiting. Denies any pain at present. No c/o vaginal bleeding. Will continue to monitor. Encouraged to call for assistance as needed. Call light in reach. Sr x2 elevated. Bed in low position.

## 2024-02-06 NOTE — Progress Notes (Signed)
 Pt complains of discomfort to rt hand; below the iv site. Site re taped and an ice pack placed.

## 2024-02-06 NOTE — Plan of Care (Signed)
 PT RECEIVED FROM PACU. TRANSPORTED VIA BED. PT ALERT AND ORIENTED X4. FOLEY INTACT AND PATENT: DRAINING CLEAR YELLOW URINE. DENIES PAIN AT PRESENT. PLAN OF CARE DISCUSSED. CALL LIGHT IN REACH. SR X3 ELEVATED. BED IN LOW POSITION.

## 2024-02-06 NOTE — Progress Notes (Signed)
 GYN Progress Note HD#2, POD#1  S: Selena Brackins is an 78yo now POD#1 s/p exam under anesthesia for ongoing vaginal bleeding after recent rectocele repair (4/17) admitted for post-op monitoring. Patient with initially uncomplicated rectocele repair, then seen in office 4/23 for ongoing vaginal bleeding. Vaginal packing placed in office and scheduled for exam under anesthesia to better evaluate. During EUA small 3cm dehiscence of rectocele repair noted and repaired. Gel foam and vaginal packing placed to ensure hemostasis.   Today, patient reports doing overall well. Had episode of nausea after walking around unit but improved with zofran . Tolerate lunch without further nausea or vomiting. Ambulating around unit without issue. Feels pressure in her vagina from packing but overall comfortable. Wearing a pad and notes scant bleeding on pad. Foley in situ draining clear urine.   O:  Vitals:   02/06/24 0804 02/06/24 1504  BP: 119/65 117/66  Pulse: 91 86  Resp: 18 18  Temp: 98.2 F (36.8 C) 98.2 F (36.8 C)  SpO2: 91% 100%   Physical exam: GA: well appearing, NAD Chest: normal work of breathing on room air Abd: soft, NTND Perineum: foley in situ, vaginal packing gently removed, no active bleeding noted; scant dark red blood on pad, ~20% filled  Ext: no signs of DVT  Labs: Lab Results  Component Value Date   WBC 8.4 02/06/2024   HGB 10.7 (L) 02/06/2024   HCT 32.6 (L) 02/06/2024   MCV 89.6 02/06/2024   PLT 223 02/06/2024   A/P: Selena Cruz is an 78yo now POD#1 s/p exam under anesthesia for ongoing vaginal bleeding after recent rectocele repair (4/17) admitted for post-op monitoring. Plan to remove foley catheter and monitor. Vitals within normal limits, Hgb with expected post-surgical drop. Physical exam overall reassuring with no signs of active bleeding. Will continue to monitor with packing removed and consider discharge home later tonight versus tomorrow morning if bleeding remains  stable and patient able to urinate.   Ellsworth Haas, MD

## 2024-02-06 NOTE — Care Management Obs Status (Signed)
 MEDICARE OBSERVATION STATUS NOTIFICATION   Patient Details  Name: KIMA DASINGER MRN: [id number] Date of Birth: 1946-02-13   Medicare Observation Status Notification Given:  Yes    Omie Bickers, RN 02/06/2024, 4:10 PM

## 2024-02-07 DIAGNOSIS — N938 Other specified abnormal uterine and vaginal bleeding: Secondary | ICD-10-CM | POA: Diagnosis not present

## 2024-02-07 LAB — CBC
HCT: 33.8 % — ABNORMAL LOW (ref 36.0–46.0)
Hemoglobin: 10.9 g/dL — ABNORMAL LOW (ref 12.0–15.0)
MCH: 29.5 pg (ref 26.0–34.0)
MCHC: 32.2 g/dL (ref 30.0–36.0)
MCV: 91.6 fL (ref 80.0–100.0)
Platelets: 219 10*3/uL (ref 150–400)
RBC: 3.69 MIL/uL — ABNORMAL LOW (ref 3.87–5.11)
RDW: 13.1 % (ref 11.5–15.5)
WBC: 7.7 10*3/uL (ref 4.0–10.5)
nRBC: 0 % (ref 0.0–0.2)

## 2024-02-07 NOTE — Progress Notes (Signed)
 GYN Progress Note HD#3, POD#2   S: Patient doing overall well, reports slept well overnight. Ambulating, voiding, tolerating regular diet without issue. Minimal vaginal bleeding, lighter than yesterday.   O:  Vitals:   02/07/24 0723 02/07/24 0923  BP: (!) 86/54 (!) 85/55  Pulse: 81   Resp: 18 18  Temp: 97.9 F (36.6 C)   SpO2: 93% 95%    Physical exam: GA: well appearing, NAD Chest: normal work of breathing on room air Abd: soft, NTND Perineum: Scant dark red blood on pad. Ext: no signs of DVT   Labs: Lab Results  Component Value Date   WBC 7.7 02/07/2024   HGB 10.9 (L) 02/07/2024   HCT 33.8 (L) 02/07/2024   MCV 91.6 02/07/2024   PLT 219 02/07/2024    A/P: Selena Cruz is an 78yo now POD#2 s/p exam under anesthesia for ongoing vaginal bleeding after recent rectocele repair (4/17) admitted for post-op monitoring. Plan to remove foley catheter and monitor. Vitals within normal limits, Hgb with expected post-surgical drop, stable today on repeat. Physical exam overall reassuring with no signs of active bleeding. Plan for discharge home today.    Ellsworth Haas, MD

## 2024-02-07 NOTE — Discharge Instructions (Addendum)
 Post-Operative Discharge Instructions  Surgery: Exam under anesthesia, revision of rectocele repair  Surgeon: Terri Fester, MD  Please follow the instructions below during your recovery period. If you have questions or concerns please call our office at [phone].  Please look out for the following and call the office immediately or go to nearest Emergency Room if you experience: - Chest pain - Shortness of breath - Fever >100.4 F  - Persistent nausea and/or vomiting - Difficulty urinating or not urinating for >6 hours  - Severe abdominal or pelvic pain not responsive to recommended pain medications - Heavy vaginal bleeding - Pain, swelling, and/or redness in one leg   Activity: You may resume normal activity as tolerated with the exception of heavy lifting. Do not lift anything over 5lbs. We recommend walking as soon and as much as you are comfortably able.  Please do not put in anything in the vagina for 6 weeks after your surgery. This includes: - No intercourse - No tampons - No douching - No sitting in water  (e.g. pools, baths, etc.)   Expectations: - Feeling more tired than usual for a few days following anesthesia is normal.   Diet: You may resume normal diet immediately after surgery. Anesthesia can make you feel nauseous, we recommend avoiding spicy or acidic foods for the first 72 hours after surgery. Drink lots of water  (64oz/day).   Pain: We expect you to have some abdominal/pelvic pain and cramping for a few days after your surgery. We recommend taking medication around the clock for the first 48-72 hours after your surgery as follows: - Acetaminophen  (Tylenol ) 1000mg  every 6 hours  - Ibuprofen  (Advil  or Motrin ) 600mg  every 6 hours - Miralax or other stool softener to keep bowel movements soft and avoid straining   We wish you a speedy recovery!

## 2024-02-07 NOTE — Anesthesia Postprocedure Evaluation (Signed)
 Anesthesia Post Note  Patient: Selena Cruz  Procedure(s) Performed: Laurita Porta UNDER ANESTHESIA, PELVIC     Patient location during evaluation: PACU Anesthesia Type: MAC Level of consciousness: awake and alert Pain management: pain level controlled Vital Signs Assessment: post-procedure vital signs reviewed and stable Respiratory status: spontaneous breathing, nonlabored ventilation, respiratory function stable and patient connected to nasal cannula oxygen Cardiovascular status: stable and blood pressure returned to baseline Postop Assessment: no apparent nausea or vomiting Anesthetic complications: no   No notable events documented.                  Lashawne Dura

## 2024-02-07 NOTE — Plan of Care (Signed)

## 2024-02-07 NOTE — Discharge Summary (Signed)
 Physician Discharge Summary  Patient ID: ITCEL BUTTRY MRN: [id number] DOB/AGE: 04-02-1946 78 y.o.  Admit date: 02/05/2024 Discharge date: 02/07/2024  Admission Diagnoses:  Discharge Diagnoses:  Principal Problem:   Vaginal bleeding, abnormal   Discharged Condition: good  Hospital Course: Selena Cruz is an 78yo female admitted for exam under anesthesia for ongoing vaginal bleeding after recent rectocele repair (4/17) admitted for post-op monitoring. Patient with initially uncomplicated rectocele repair, then seen in office 4/23 for ongoing vaginal bleeding. Vaginal packing placed in office and scheduled for exam under anesthesia to better evaluate. During EUA small 3cm dehiscence of rectocele repair noted and repaired. Gel foam and vaginal packing placed to ensure hemostasis. Bleeding monitored and packing removed on POD#1. Foley removed and patient voided. Continued light bleeding after packing removal, patient remained admitted for continued monitoring. Bleeding scant on POD#2 and patient discharged home with outpatient follow-up.  Consults: None  Significant Diagnostic Studies: labs:     Latest Ref Rng & Units 02/07/2024    9:20 AM 02/06/2024   11:25 AM 01/28/2024    8:46 AM  CBC  WBC 4.0 - 10.5 K/uL 7.7  8.4  6.6   Hemoglobin 12.0 - 15.0 g/dL 78.2  95.6  21.3   Hematocrit 36.0 - 46.0 % 33.8  32.6  40.1   Platelets 150 - 400 K/uL 219  223  181     Treatments: surgery: EUA, revision of rectocele repair  Discharge Exam: Blood pressure (!) 85/55, pulse 81, temperature 97.9 F (36.6 C), temperature source Oral, resp. rate 18, height 5' 6.5" (1.689 m), weight 73.5 kg, SpO2 95%. Physical exam: GA: well appearing, NAD Chest: normal work of breathing on room air Abd: soft, NTND Perineum: scant bleeding Ext: no signs of DVT  Disposition:  There are no questions and answers to display.         Allergies as of 02/07/2024       Reactions   Augmentin [amoxicillin-pot  Clavulanate] Diarrhea        Medication List     TAKE these medications    acetaminophen  500 MG tablet Commonly known as: TYLENOL  Take 1 tablet (500 mg total) by mouth every 6 (six) hours as needed.   aspirin EC 81 MG tablet Take 81 mg by mouth every evening. Swallow whole.   atorvastatin 40 MG tablet Commonly known as: LIPITOR Take 40 mg by mouth daily.   carboxymethylcellulose 0.5 % Soln Commonly known as: REFRESH PLUS Place 1 drop into both eyes daily.   docusate sodium  100 MG capsule Commonly known as: COLACE 100 mg daily as needed.   multivitamin with minerals Tabs tablet Take 1 tablet by mouth daily. Centrum Silver   Natural Vitamin D -3 125 MCG (5000 UT) Tabs Generic drug: Cholecalciferol Take 5,000 Units by mouth daily.   pantoprazole  40 MG tablet Commonly known as: PROTONIX  Take 40 mg by mouth daily with supper.   telmisartan 40 MG tablet Commonly known as: MICARDIS Take 40 mg by mouth daily.        Follow-up Information     Terri Fester, MD. Schedule an appointment as soon as possible for a visit in 1 week(s).   Specialty: Obstetrics and Gynecology Contact information: [address] [place] [address] [phone]                 Signed: Tresa Frohlich D'iorio 02/07/2024, 10:29 AM

## 2024-02-07 NOTE — Progress Notes (Signed)
 One peri-pad changed. Small amt of dk blood noted on the pad. No clots seen. Will continue to monitor discharge.

## 2024-02-08 ENCOUNTER — Encounter (HOSPITAL_COMMUNITY): Payer: Self-pay | Admitting: Obstetrics & Gynecology

## 2024-03-13 DEATH — deceased
